# Patient Record
Sex: Female | Born: 1975 | Race: White | Hispanic: No | Smoking: Never smoker
Health system: Southern US, Community
[De-identification: ages and names within clinical notes are randomized; demographics above are authoritative.]

## PROBLEM LIST (undated history)

## (undated) DIAGNOSIS — J45909 Unspecified asthma, uncomplicated: Secondary | ICD-10-CM

## (undated) DIAGNOSIS — F419 Anxiety disorder, unspecified: Secondary | ICD-10-CM

## (undated) DIAGNOSIS — F32A Depression, unspecified: Secondary | ICD-10-CM

## (undated) DIAGNOSIS — E079 Disorder of thyroid, unspecified: Secondary | ICD-10-CM

## (undated) DIAGNOSIS — T7840XA Allergy, unspecified, initial encounter: Secondary | ICD-10-CM

## (undated) DIAGNOSIS — E039 Hypothyroidism, unspecified: Secondary | ICD-10-CM

## (undated) HISTORY — DX: Anxiety disorder, unspecified: F41.9

## (undated) HISTORY — PX: APPENDECTOMY: SHX54

## (undated) HISTORY — DX: Depression, unspecified: F32.A

## (undated) HISTORY — DX: Allergy, unspecified, initial encounter: T78.40XA

## (undated) HISTORY — PX: COLPOSCOPY: SHX161

## (undated) HISTORY — DX: Unspecified asthma, uncomplicated: J45.909

## (undated) HISTORY — DX: Disorder of thyroid, unspecified: E07.9

---

## 2011-02-20 DIAGNOSIS — J309 Allergic rhinitis, unspecified: Secondary | ICD-10-CM | POA: Insufficient documentation

## 2011-02-21 DIAGNOSIS — R3129 Other microscopic hematuria: Secondary | ICD-10-CM | POA: Insufficient documentation

## 2013-10-19 DIAGNOSIS — E039 Hypothyroidism, unspecified: Secondary | ICD-10-CM | POA: Insufficient documentation

## 2016-01-01 ENCOUNTER — Ambulatory Visit
Admit: 2016-01-01 | Discharge: 2016-01-01 | Payer: PRIVATE HEALTH INSURANCE | Attending: Family Medicine | Primary: Family Medicine

## 2016-01-01 DIAGNOSIS — E039 Hypothyroidism, unspecified: Secondary | ICD-10-CM

## 2016-01-01 MED ORDER — MONTELUKAST 10 MG TAB
10 mg | ORAL_TABLET | Freq: Every day | ORAL | 3 refills | Status: DC
Start: 2016-01-01 — End: 2016-07-02

## 2016-01-01 MED ORDER — ESCITALOPRAM 10 MG TAB
10 mg | ORAL_TABLET | Freq: Every day | ORAL | 1 refills | Status: DC
Start: 2016-01-01 — End: 2016-07-02

## 2016-01-01 NOTE — Progress Notes (Signed)
[facility]                                                              Willene Hatchet, MD                                                                 [address]                                                              [place], [place] [address]                                                             [phone]            CHIEF COMPLAINT:   Chief Complaint   Patient presents with   ??? Establish Care       HISTORY OF PRESENT ILLNESS:   Kiara Young is a 40 y.o. G2 P2 female here to establish care. She is coming from [place]. [place] in [place].   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   40yo, 7y, boys    Her PMH is significant for:    Hypothyroidism - diagnosed several years ago, in college, but has never seen endocrinology. She denies any new symptoms of hypothyroidism.  No complaints of palpitations, weight loss or weight gain, changes in bowel movements.    Last TSH: 1 month ago and value of 30+ she was adjusted from 200 MCG to 250 MCG. She takes BRAND NAME ONLY Synthroid.  Patient states that she feels best when TSH is around 1  Does not know if has thyroid antibodies. She was stable for a long time, increasing doses required over past 18  Months. She is trying lower carb diet.   Mother has h/o hyperthyroid with occular complications. Had RAI treatment, now hypothyroid.    Depression/anxiety:  Symptoms are stable with Lexapro. She denies current suicidal and homicidal plan or intent. She complains of the following side effects from the treatment: none. She has been on the Lexapro for 1-2 years. She has taken Wellbutrin and Paxil in the past. No known weight gain due to meds.    Asthma/Allergies: currently taking Singulair 10 MG for many, many years.  Symptoms stable with current treatment. Does not use inhaler. Does not take antihistamine      Her acute problems are:    None- just concerned about thyroid issue above    Wellness:  Diet-trying to give up gluten, helping with IBS symptoms. Eats dairy daily. Well balanced diet. Caffeine- 2-3 cups daily.   Exercise- walking dog daily, 10-15 mins  Menstrual/ Menopause/Hormone History:  G2P2 Menstrual cycles regular, every 28-30 days, lasting 5 days. No recent hx of OCP.     PREVENTATIVE CARE:    Mammogram: 2 years ago,  has h/o fibrocystic breasts, was supposed to f/u in 1 year  Bone Density: never  Colonoscopy: never  Pap Smear: 2016- normal- will have done here  Cholesterol Screen: 2016- nml    Immunizations:  Tdap: 2014  Flu: declined    Past Medical History:   Diagnosis Date   ??? Acid reflux    ??? Anxiety    ??? Asthma    ??? Depression    ??? H/O seasonal allergies    ??? Thyroid disease     hypothyroidism     Past Surgical History:   Procedure Laterality Date   ??? HX APPENDECTOMY  1990     Family History   Problem Relation Age of Onset   ??? Hypertension Mother    ??? Thyroid Disease Mother      Social History     Social History   ??? Marital status: MARRIED     Spouse name: N/A   ??? Number of children: N/A   ??? Years of education: N/A     Occupational History   ??? Not on file.     Social History Main Topics   ??? Smoking status: Never Smoker   ??? Smokeless tobacco: Never Used   ??? Alcohol use 10.2 - 12.0 oz/week     10 Standard drinks or equivalent, 7 - 10 Glasses of wine per week      Comment: 7-10 weekly   ??? Drug use: No   ??? Sexual activity: Yes     Partners: Male     Other Topics Concern   ??? Not on file     Social History Narrative           No Known Allergies      MEDICATIONS:     Current Outpatient Prescriptions:   ???  SYNTHROID 200 mcg tablet, DAW BRAND NAME ONLY, Disp: , Rfl: 3  ???  SYNTHROID 50 mcg tablet, DAW BRAND NAME ONLY, Disp: , Rfl: 3  ???  escitalopram oxalate (LEXAPRO) 10 mg tablet, Take 1 Tab by mouth  daily., Disp: 90 Tab, Rfl: 1  ???  montelukast (SINGULAIR) 10 mg tablet, Take 1 Tab by mouth daily., Disp: 90 Tab, Rfl: 3        REVIEW OF SYSTEMS:    GENERAL: no fever, no generalized malaise, weight slowly increasing  EYES: no visual changes or other eye complaints  ENT:  No ear complaints, No nasal discharge or congestion, no sore throat, no difficulty swallowing  CARDIOVASCULAR: no chest pain or heart palpitations, no edema  RESPIRATORY: no shortness of breath, cough or wheezing  GASTROINTESTINAL: no nausea, vomiting, diarrhea, constipation, reflux or abdominal pain  GU:  No dysuria, urinary frequency, incontinence, vaginal discharge or itching  MUSCULOSKELETAL: no muscle or joint pain  NEURO:  No confusion, no headaches, no numbness or tingling  PSYCHIATRIC: no complaints of depression, anxiety -  DIFFICULTY SLEEPING, CANNOT STAY ASLEEP, TAKE BENADRYL WHEN NEEDED      PHYSICAL EXAM    Vitals -   Visit Vitals   ??? BP 112/78 (BP 1 Location: Right arm, BP Patient Position: Sitting)   ??? Pulse 85   ??? Ht 5\' 6"  (1.676 m)   ??? Wt 190 lb (86.2 kg)   ??? LMP 12/18/2015   ??? SpO2 99%   ???  BMI 30.67 kg/m2        General appearance - alert, well appearing, no distress  HEENT - normocephalic, atraumatic   Eyes:  PERRLA; ANISOCORIA- LEFT PUPIL LARGER DUE TO EYE INJURY   Oropharynx:  no erythema or exudate noted   Neck:  normal thyroid, no masses  Lymph - no cervical lymphadenopathy noted  CV - regular rate and rhythm, no murmurs   Carotid arteries: no bruits noted   Extremities: no discoloration, no edema noted, pulses 2+ bilaterally  Resp  - normal breath sounds, clear to auscultation  GI - soft, nontender, nondistended, no masses noted  Neurological - alert, oriented, CN2-12 grossly intact  Musculoskeletal - no obvious deformities noted, normal gait/station   Spine - non-tender to palpation   Extremities - non-tender, no swelling or redness noted   Muscles - no trigger points identified   Skin - normal coloration and turgor, no rashes, no suspicious skin lesions noted  Psych - normal mood and affect      ASSESSMENT AND PLAN:      ICD-10-CM ICD-9-CM    1. Hypothyroidism, unspecified type E03.9 244.9 TSH 3RD GENERATION      T3, FREE      T4, FREE      T3, REVERSE      THYROID ANTIBODY PANEL      VITAMIN D, 25 HYDROXY   2. Visit for screening mammogram Z12.31 V76.12 MAM MAMMO BI SCREENING INCL CAD   3. Screening for disorder of blood and blood-forming organs Z13.0 V78.9 CBC WITH AUTOMATED DIFF   4. Lipid screening Z13.220 V77.91 LIPID PANEL   5. Screening for diabetes mellitus (DM) Z13.1 V77.1 METABOLIC PANEL, COMPREHENSIVE      HEMOGLOBIN A1C WITH EAG           Follow-up Disposition:  Return in about 3 months (around 04/01/2016) for hypothyroid, tsh prior.        Patient Instructions   We will send you copy of lab results. We will notify you by phone of thyroid findings and make adjustment to medication as indicated. We may try switching you to Levoxyl if levels are not stable.        Electronically signed by: Willene Hatchet, MD

## 2016-01-01 NOTE — Patient Instructions (Signed)
We will send you copy of lab results. We will notify you by phone of thyroid findings and make adjustment to medication as indicated. We may try switching you to Levoxyl if levels are not stable.

## 2016-01-02 LAB — METABOLIC PANEL, COMPREHENSIVE
A-G Ratio: 1.5 (ref 1.2–2.2)
ALT (SGPT): 12 IU/L (ref 0–32)
AST (SGOT): 20 IU/L (ref 0–40)
Albumin: 4.1 g/dL (ref 3.5–5.5)
Alk. phosphatase: 54 IU/L (ref 39–117)
BUN/Creatinine ratio: 15 (ref 9–23)
BUN: 9 mg/dL (ref 6–20)
Bilirubin, total: 0.4 mg/dL (ref 0.0–1.2)
CO2: 21 mmol/L (ref 18–29)
Calcium: 8.9 mg/dL (ref 8.7–10.2)
Chloride: 100 mmol/L (ref 96–106)
Creatinine: 0.61 mg/dL (ref 0.57–1.00)
GFR est AA: 132 mL/min/{1.73_m2} (ref >59)
GFR est non-AA: 115 mL/min/{1.73_m2} (ref >59)
GLOBULIN, TOTAL: 2.7 g/dL (ref 1.5–4.5)
Glucose: 92 mg/dL (ref 65–99)
Potassium: 4.5 mmol/L (ref 3.5–5.2)
Protein, total: 6.8 g/dL (ref 6.0–8.5)
Sodium: 138 mmol/L (ref 134–144)

## 2016-01-02 LAB — CBC WITH AUTOMATED DIFF
ABS. BASOPHILS: 0 10*3/uL (ref 0.0–0.2)
ABS. EOSINOPHILS: 0.1 10*3/uL (ref 0.0–0.4)
ABS. IMM. GRANS.: 0 10*3/uL (ref 0.0–0.1)
ABS. MONOCYTES: 0.5 10*3/uL (ref 0.1–0.9)
ABS. NEUTROPHILS: 3 10*3/uL (ref 1.4–7.0)
Abs Lymphocytes: 1.8 10*3/uL (ref 0.7–3.1)
BASOPHILS: 0 %
EOSINOPHILS: 2 %
HCT: 40.2 % (ref 34.0–46.6)
HGB: 13.4 g/dL (ref 11.1–15.9)
IMMATURE GRANULOCYTES: 0 %
Lymphocytes: 33 %
MCH: 29.3 pg (ref 26.6–33.0)
MCHC: 33.3 g/dL (ref 31.5–35.7)
MCV: 88 fL (ref 79–97)
MONOCYTES: 9 %
NEUTROPHILS: 56 %
PLATELET: 280 10*3/uL (ref 150–379)
RBC: 4.57 x10E6/uL (ref 3.77–5.28)
RDW: 12.7 % (ref 12.3–15.4)
WBC: 5.3 10*3/uL (ref 3.4–10.8)

## 2016-01-02 LAB — LIPID PANEL
Cholesterol, total: 135 mg/dL (ref 100–199)
HDL Cholesterol: 78 mg/dL (ref >39)
LDL, calculated: 33 mg/dL (ref 0–99)
Triglyceride: 119 mg/dL (ref 0–149)
VLDL, calculated: 24 mg/dL (ref 5–40)

## 2016-01-02 LAB — T3, FREE: Triiodothyronine (T3), free: 3.9 pg/mL (ref 2.0–4.4)

## 2016-01-02 LAB — HEMOGLOBIN A1C WITH EAG
Estimated average glucose: 108 mg/dL
Hemoglobin A1c: 5.4 % (ref 4.8–5.6)

## 2016-01-02 LAB — CVD REPORT

## 2016-01-02 LAB — VITAMIN D, 25 HYDROXY: VITAMIN D, 25-HYDROXY: 26.7 ng/mL — ABNORMAL LOW (ref 30.0–100.0)

## 2016-01-02 LAB — TSH 3RD GENERATION: TSH: 0.033 u[IU]/mL — ABNORMAL LOW (ref 0.450–4.500)

## 2016-01-02 LAB — T4, FREE: T4, Free: 2.59 ng/dL — ABNORMAL HIGH (ref 0.82–1.77)

## 2016-01-04 LAB — T3, REVERSE: REVERSE T3, SERUM: 31.6 ng/dL — ABNORMAL HIGH (ref 9.2–24.1)

## 2016-01-04 LAB — THYROID ANTIBODY PANEL
Thyroglobulin Ab: <1 IU/mL (ref 0.0–0.9)
Thyroid peroxidase Ab: 74 IU/mL — ABNORMAL HIGH (ref 0–34)

## 2016-01-04 MED ORDER — LEVOTHYROXINE 25 MCG TAB
25 mcg | ORAL_TABLET | Freq: Every day | ORAL | 5 refills | Status: DC
Start: 2016-01-04 — End: 2016-04-11

## 2016-01-04 NOTE — Progress Notes (Signed)
Patient's hypothyroid is over-treated. Her TSH is .03. This may be due to her dietary changes and large increase in dose last time. We will reduce her synthroid to 125 mcg. Also, Vit D is a little low. She should take a daily supplement of It D3 2,0000 units if she is not already.

## 2016-01-04 NOTE — Progress Notes (Signed)
Correction- that is 225- will send rx for 25 mcg tablet to take with her 200 mcg.

## 2016-01-04 NOTE — Progress Notes (Signed)
Left VM

## 2016-01-08 NOTE — Progress Notes (Signed)
Left vm

## 2016-01-10 ENCOUNTER — Inpatient Hospital Stay: Admit: 2016-01-10 | Payer: PRIVATE HEALTH INSURANCE | Attending: Family Medicine | Primary: Family Medicine

## 2016-01-10 DIAGNOSIS — Z1231 Encounter for screening mammogram for malignant neoplasm of breast: Secondary | ICD-10-CM

## 2016-01-10 NOTE — Progress Notes (Signed)
Called number. Rang for about 30 seconds, then received busy tone. Will try back.

## 2016-01-10 NOTE — Progress Notes (Signed)
Called number. Rang for about 30 seconds, then received busy tone. Will try back.

## 2016-01-10 NOTE — Progress Notes (Signed)
No return call. Will mail

## 2016-01-13 NOTE — Progress Notes (Signed)
Please notify patient that her mammogram was negative for signs of malignancy. She should repeat mammogram in one year.

## 2016-02-12 MED ORDER — SYNTHROID 200 MCG TABLET
200 mcg | ORAL_TABLET | Freq: Every day | ORAL | 3 refills | Status: DC
Start: 2016-02-12 — End: 2016-07-02

## 2016-02-12 NOTE — Telephone Encounter (Signed)
Pt called needing 90 day supply sent in for insurance purposes

## 2016-03-18 ENCOUNTER — Encounter

## 2016-04-08 ENCOUNTER — Encounter: Admit: 2016-04-08 | Discharge: 2016-04-08 | Payer: PRIVATE HEALTH INSURANCE | Primary: Family Medicine

## 2016-04-08 DIAGNOSIS — E039 Hypothyroidism, unspecified: Secondary | ICD-10-CM

## 2016-04-09 LAB — T4, FREE: T4, Free: 1.41 ng/dL (ref 0.82–1.77)

## 2016-04-09 LAB — TSH 3RD GENERATION: TSH: 0.098 u[IU]/mL — ABNORMAL LOW (ref 0.450–4.500)

## 2016-04-11 ENCOUNTER — Ambulatory Visit
Admit: 2016-04-11 | Discharge: 2016-04-11 | Payer: PRIVATE HEALTH INSURANCE | Attending: Family Medicine | Primary: Family Medicine

## 2016-04-11 ENCOUNTER — Encounter: Attending: Family Medicine | Primary: Family Medicine

## 2016-04-11 DIAGNOSIS — Z1283 Encounter for screening for malignant neoplasm of skin: Secondary | ICD-10-CM

## 2016-04-11 MED ORDER — LEVOTHYROXINE 25 MCG TAB
25 mcg | ORAL_TABLET | Freq: Every day | ORAL | 1 refills | Status: DC
Start: 2016-04-11 — End: 2016-07-02

## 2016-04-11 NOTE — Patient Instructions (Signed)
Decrease synthroid dose to alternating with 225 mcg.  Recheck level in 2 months. I will call you with recommendations.

## 2016-04-11 NOTE — Progress Notes (Signed)
[facility]  Kiara HatchetJulie Elienai Gailey, MD  [address]  [place], [place] [id number]  [phone]          CHIEF COMPLAINT:   Chief Complaint   Patient presents with   ??? Follow-up     thyroid   ??? Medication Refill       HISTORY OF PRESENT ILLNESS:   [name] is a 40 y.o. female is here today for evaluation of:    Hypothyroidism - diagnosed several years ago. Stable on current dose of medication, BRAND NAME Synthroid 225 mcg. 3 months ago we decrease her dose from 250 to 225. She denies any new symptoms of hypothyroidism.  No complaints of palpitations, weight loss or weight gain, changes in bowel movements.  Last TSH:   Lab Results   Component Value Date/Time    TSH 0.098 04/08/2016 08:52 AM    TSH 0.033 01/01/2016 10:24 AM       Past Medical History:   Diagnosis Date   ??? Acid reflux    ??? Anxiety    ??? Asthma    ??? Depression    ??? H/O seasonal allergies    ??? Thyroid disease     hypothyroidism         No Known Allergies      MEDICATIONS:     Current Outpatient Prescriptions:   ???  levothyroxine (SYNTHROID) 25 mcg tablet, Take 1 Tab by mouth Daily (before breakfast)., Disp: 90 Tab, Rfl: 1  ???  SYNTHROID 200 mcg tablet, Take 1 Tab by mouth Daily (before breakfast). DAW BRAND NAME ONLY, Disp: 90 Tab, Rfl: 3  ???  escitalopram oxalate (LEXAPRO) 10 mg tablet, Take 1 Tab by mouth daily., Disp: 90 Tab, Rfl: 1  ???  montelukast (SINGULAIR) 10 mg tablet, Take 1 Tab by mouth daily., Disp: 90 Tab, Rfl: 3        REVIEW OF SYSTEMS:  GENERAL: no fever, generalized malaise, or unexplained weight changes    See HPI for pertinent ROS        PHYSICAL EXAM    Vitals -   Visit Vitals   ??? BP 122/74 (BP 1 Location: Right arm, BP Patient Position: Sitting)   ??? Pulse 86   ??? Ht 5\' 6"  (1.676 m)   ??? Wt 193 lb (87.5 kg)   ??? LMP 03/12/2016   ??? SpO2 98%   ??? BMI 31.15 kg/m2            General appearance - alert, well appearing, and in no distress    CV - regular rate and rhythm, no murmurs auscultated         ASSESSMENT AND PLAN:       ICD-10-CM ICD-9-CM    1. Screening for skin cancer Z12.83 V76.43 REFERRAL TO DERMATOLOGY   2. Hypothyroidism, unspecified type E03.9 244.9 levothyroxine (SYNTHROID) 25 mcg tablet           Follow-up Disposition:  Return in about 6 months (around 10/12/2016) for hypothyroid, labs prior.    I have reviewed/discussed the above normal BMI with the patient.  I have recommended the following interventions: encourage exercise and monitor weight . Marland Kitchen.        Patient Instructions   Decrease synthroid dose to 200mcg alternating with 225 mcg.  Recheck level in 2 months. I will call you with recommendations.          Electronically signed by: Kiara HatchetJulie [place], MD

## 2016-04-12 NOTE — Progress Notes (Signed)
rvw ed at visit

## 2016-07-01 NOTE — Telephone Encounter (Signed)
Patient called requesting acyclovir for fever blisters - she is out of town, so [facility] [place] [facility] [place] [facility] [place] [facility]. I updated pharmacy if you can send it in.

## 2016-07-02 ENCOUNTER — Encounter

## 2016-07-02 MED ORDER — ACYCLOVIR 800 MG TAB
800 mg | ORAL_TABLET | ORAL | 0 refills | Status: AC
Start: 2016-07-02 — End: ?

## 2016-07-02 MED ORDER — ESCITALOPRAM 10 MG TAB
10 mg | ORAL_TABLET | Freq: Every day | ORAL | 0 refills | Status: DC
Start: 2016-07-02 — End: 2016-08-26

## 2016-07-02 MED ORDER — LEVOTHYROXINE 25 MCG TAB
25 mcg | ORAL_TABLET | Freq: Every day | ORAL | 0 refills | Status: DC
Start: 2016-07-02 — End: 2016-11-05

## 2016-07-02 MED ORDER — MONTELUKAST 10 MG TAB
10 mg | ORAL_TABLET | Freq: Every day | ORAL | 0 refills | Status: AC
Start: 2016-07-02 — End: ?

## 2016-07-02 MED ORDER — SYNTHROID 200 MCG TABLET
200 mcg | ORAL_TABLET | Freq: Every day | ORAL | 0 refills | Status: DC
Start: 2016-07-02 — End: 2016-11-05

## 2016-07-02 NOTE — Telephone Encounter (Signed)
Pt called stating she is out of town due to her mother having a heart attack. She forgot her meds and is wondering if we could send in a 5-7 day supply for her. Will change pharmacy in chart.     Acyclovir, synthroid, singulair, and lexapro

## 2016-07-02 NOTE — Telephone Encounter (Signed)
rx sent

## 2016-07-02 NOTE — Telephone Encounter (Signed)
Pt Aware

## 2016-07-03 NOTE — Telephone Encounter (Signed)
Pt Aware.

## 2016-08-26 MED ORDER — ESCITALOPRAM 10 MG TAB
10 mg | ORAL_TABLET | Freq: Every day | ORAL | 1 refills | Status: DC
Start: 2016-08-26 — End: 2017-02-17

## 2016-08-26 NOTE — Telephone Encounter (Signed)
Pt Aware

## 2016-08-26 NOTE — Telephone Encounter (Signed)
Pt called asking for an rx refill on generic Lexapro 10mg  for a 90 day supply sent to [facility]

## 2016-08-26 NOTE — Telephone Encounter (Signed)
Sent - pt has appt in Jan 2018

## 2016-09-10 NOTE — Telephone Encounter (Signed)
I suspect patient either had mild version of flu for 5 days or she has had viral cold,  therefore medication will not help either way. I recommend that she come in for flu testing immediately if she feels she is developing flu- like symptoms and we will treat accordingly.     Patient agrees with plan of care

## 2016-09-10 NOTE — Telephone Encounter (Signed)
Pt called saying her whole family was diagnosed with the flu and she has had some mild symptoms and she wanted to know if you could send her in some Tamiflu for prevention?

## 2016-09-10 NOTE — Telephone Encounter (Signed)
What are her symptoms?  Any fever?   How many days has she been sick?  Are sick contacts in her immediate family?    I will send in appropriate meds.

## 2016-09-10 NOTE — Telephone Encounter (Signed)
Pt has been sick for about 5 days with sore throat, congestion, cough, and low grade fever.     Husband and son were both diagnosed by their physicians for the the flu and she is afraid she is getting it.

## 2016-09-11 NOTE — Telephone Encounter (Signed)
Pt Aware

## 2016-10-07 ENCOUNTER — Other Ambulatory Visit: Admit: 2016-10-07 | Discharge: 2016-10-07 | Payer: BLUE CROSS/BLUE SHIELD | Primary: Family Medicine

## 2016-10-07 DIAGNOSIS — E039 Hypothyroidism, unspecified: Secondary | ICD-10-CM

## 2016-10-08 LAB — METABOLIC PANEL, BASIC
BUN/Creatinine ratio: 18 (ref 9–23)
BUN: 13 mg/dL (ref 6–24)
CO2: 23 mmol/L (ref 18–29)
Calcium: 9.3 mg/dL (ref 8.7–10.2)
Chloride: 101 mmol/L (ref 96–106)
Creatinine: 0.71 mg/dL (ref 0.57–1.00)
GFR est AA: 123 mL/min/{1.73_m2} (ref >59)
GFR est non-AA: 107 mL/min/{1.73_m2} (ref >59)
Glucose: 96 mg/dL (ref 65–99)
Potassium: 4.8 mmol/L (ref 3.5–5.2)
Sodium: 138 mmol/L (ref 134–144)

## 2016-10-08 LAB — TSH RFX ON ABNORMAL TO FREE T4: TSH: 0.068 u[IU]/mL — ABNORMAL LOW (ref 0.450–4.500)

## 2016-10-08 LAB — T4F: T4,Free (Direct): 1.63 ng/dL (ref 0.82–1.77)

## 2016-10-08 LAB — VITAMIN D, 25 HYDROXY: VITAMIN D, 25-HYDROXY: 36.4 ng/mL (ref 30.0–100.0)

## 2016-10-09 NOTE — Progress Notes (Signed)
rvw at appt

## 2016-10-10 ENCOUNTER — Encounter: Attending: Family Medicine | Primary: Family Medicine

## 2016-11-05 ENCOUNTER — Ambulatory Visit
Admit: 2016-11-05 | Discharge: 2016-11-05 | Payer: BLUE CROSS/BLUE SHIELD | Attending: Family Medicine | Primary: Family Medicine

## 2016-11-05 DIAGNOSIS — E039 Hypothyroidism, unspecified: Secondary | ICD-10-CM

## 2016-11-05 MED ORDER — SYNTHROID 200 MCG TABLET
200 mcg | ORAL_TABLET | Freq: Every day | ORAL | 3 refills | Status: AC
Start: 2016-11-05 — End: ?

## 2016-11-05 NOTE — Patient Instructions (Signed)
Ask insurance about coverage for genetic testing for FH of breast cancer, coverage of 3-D mammography in place of regularly mammography, and breast MRI testing in patient that has no personal h/o breast cancer.     Thyroid- decrease synthroid to 200 mcg daily. Repeat level in 2 months and I will call you with results.

## 2016-11-05 NOTE — Progress Notes (Signed)
[facility]  Murrell Converse, MD  [address] [place] [address]  [phone]          CHIEF COMPLAINT:   Chief Complaint   Patient presents with   ??? Follow-up       HISTORY OF PRESENT ILLNESS:   Kiara Young is a 41 y.o. female is here today for evaluation of:    Hypothyroidism - diagnosed several years ago. Current dose of medication: alternating 200 and 225 MCG every other day. She denies any new symptoms of hypothyroidism.  No complaints of palpitations, weight loss or weight gain, changes in bowel movements.  Last TSH:   Lab Results   Component Value Date/Time    TSH 0.068 (L) 10/07/2016 09:07 AM    TSH 0.098 (L) 04/08/2016 08:52 AM    TSH 0.033 (L) 01/01/2016 10:24 AM       Depression/anxiety:  Symptoms are stable with Lexapro. She denies current suicidal and homicidal plan or intent. She complains of the following side effects from the treatment: none.    Family HX of breast cancer: she has a first cousin, on her paternal side, just diagnosed with breast cancer who did genetic testing which was positive for both of her first female cousins. positive gene was BARD1. She also has a maternal aunt who had breast and colon cancer.        Past Medical History:   Diagnosis Date   ??? Acid reflux    ??? Anxiety    ??? Asthma    ??? Depression    ??? H/O seasonal allergies    ??? Thyroid disease     hypothyroidism         No Known Allergies      MEDICATIONS:     Current Outpatient Prescriptions:   ???  SYNTHROID 200 mcg tablet, Take 1 Tab by mouth Daily (before breakfast). DAW BRAND NAME ONLY, Disp: 90 Tab, Rfl: 3  ???  escitalopram oxalate (LEXAPRO) 10 mg tablet, Take 1 Tab by mouth daily., Disp: 90 Tab, Rfl: 1  ???  montelukast (SINGULAIR) 10 mg tablet, Take 1 Tab by mouth daily., Disp: 7 Tab, Rfl: 0  ???  acyclovir (ZOVIRAX) 800 mg tablet, Take one tablet every 8 hours for one day at appearance of lesion, Disp: 15 Tab, Rfl: 0        REVIEW OF SYSTEMS:   GENERAL: no fever, generalized malaise, or unexplained weight changes    See HPI for pertinent ROS        PHYSICAL EXAM    Vitals -   Vitals:    11/05/16 1509   BP: 124/78   Pulse: 93   SpO2: 98%   Weight: 193 lb (87.5 kg)   Height: '5\' 6"'  (1.676 m)        General appearance - alert, well appearing, and in no distress    HEENT - normocephalic, atraumatic   Eyes:  No exopthalmos   Neck:  normal thyroid, no masses    Lymph - no cervical lymphadenopathy noted    CV - regular rate and rhythm, no murmurs auscultated     Resp  - normal rate, normal breath sounds, clear to auscultation    Psych - normal mood and affect      ASSESSMENT AND PLAN:      ICD-10-CM ICD-9-CM    1. Hypothyroidism, unspecified type E03.9 244.9 TSH 3RD GENERATION      T3, FREE      T3, REVERSE  T4, FREE      THYROID ANTIBODY PANEL   2. Family history of breast cancer in female Z98.3 V16.3 REFERRAL TO GENETICS           Follow-up Disposition:  Return in about 6 months (around 05/05/2017) for follow up for chronic issues.    I have reviewed/discussed the above normal BMI with the patient.  I have recommended the following interventions: encourage exercise and monitor weight . Marland Kitchen        Patient Instructions   Ask insurance about coverage for genetic testing for FH of breast cancer, coverage of 3-D mammography in place of regularly mammography, and breast MRI testing in patient that has no personal h/o breast cancer.     Thyroid- decrease synthroid to 200 mcg daily. Repeat level in 2 months and I will call you with results.               Electronically signed by: Murrell Converse, MD

## 2017-02-18 MED ORDER — ESCITALOPRAM 10 MG TAB
10 mg | ORAL_TABLET | ORAL | 0 refills | Status: DC
Start: 2017-02-18 — End: 2017-06-23

## 2017-02-18 MED ORDER — MONTELUKAST 10 MG TAB
10 mg | ORAL_TABLET | ORAL | 3 refills | Status: AC
Start: 2017-02-18 — End: ?

## 2017-05-06 ENCOUNTER — Encounter: Payer: BLUE CROSS/BLUE SHIELD | Attending: Family Medicine | Primary: Family Medicine

## 2017-07-17 MED ORDER — ESCITALOPRAM 10 MG TAB
10 mg | ORAL_TABLET | ORAL | 0 refills | Status: AC
Start: 2017-07-17 — End: ?

## 2020-08-28 ENCOUNTER — Other Ambulatory Visit: Payer: Self-pay

## 2020-08-28 ENCOUNTER — Other Ambulatory Visit (HOSPITAL_COMMUNITY)
Admission: RE | Admit: 2020-08-28 | Discharge: 2020-08-28 | Disposition: A | Discharge status: Home / Self Care | Payer: BLUE CROSS/BLUE SHIELD | Source: Ambulatory Visit | Attending: Physician Assistant | Admitting: Physician Assistant

## 2020-08-28 ENCOUNTER — Encounter: Payer: Self-pay | Admitting: Physician Assistant

## 2020-08-28 ENCOUNTER — Ambulatory Visit (INDEPENDENT_AMBULATORY_CARE_PROVIDER_SITE_OTHER): Payer: BLUE CROSS/BLUE SHIELD | Admitting: Physician Assistant

## 2020-08-28 VITALS — BP 126/87 | HR 72 | Temp 97.2°F | Ht 66.0 in | Wt 179.4 lb

## 2020-08-28 DIAGNOSIS — Z113 Encounter for screening for infections with a predominantly sexual mode of transmission: Secondary | ICD-10-CM | POA: Diagnosis not present

## 2020-08-28 DIAGNOSIS — F411 Generalized anxiety disorder: Secondary | ICD-10-CM | POA: Insufficient documentation

## 2020-08-28 DIAGNOSIS — J4520 Mild intermittent asthma, uncomplicated: Secondary | ICD-10-CM | POA: Diagnosis not present

## 2020-08-28 DIAGNOSIS — F320 Major depressive disorder, single episode, mild: Secondary | ICD-10-CM | POA: Diagnosis not present

## 2020-08-28 DIAGNOSIS — E039 Hypothyroidism, unspecified: Secondary | ICD-10-CM | POA: Diagnosis not present

## 2020-08-28 DIAGNOSIS — Z23 Encounter for immunization: Secondary | ICD-10-CM | POA: Diagnosis not present

## 2020-08-28 DIAGNOSIS — Z30019 Encounter for initial prescription of contraceptives, unspecified: Secondary | ICD-10-CM

## 2020-08-28 NOTE — Patient Instructions (Addendum)
It was great to see you!  Update blood work today and STD testing.  Please get your mammogram done.  Referral to gynecology for IUD.  Take care,  Jarold Motto PA-C

## 2020-08-28 NOTE — Progress Notes (Signed)
Cynthia Casey is a 44 y.o. female here for a new problem.  History of Present Illness:   Chief Complaint  Patient presents with  . New Patient (Initial Visit)  . Referral    endocrinology, pt wants to see Dr.McConnell in [place] to manager her thyroid.    HPI   Asthma Very well controlled with singulair 10 mg.   Depression and GAD Currently well controlled on Lexapro 10 mg for years, was on Paxil, Wellbutrin at some point. No prior SI/HI. Uses trazodone 50 mg nightly and has been on this for a few years. Does not or has not seen a therapist.  Hypothyroidism Dx in college. Started for a while on levothyroxine and was very stable. In past 5 years, has had significant fluctuations of her TSH without specific cause. She is currently on synthroid 224 mcg and cytomel 5 mcg  (new since July.) Due for lab update today. Denies: chest pain, SOB, significant weight fluctuations. She is interested in endo referral.  STD testing/Contraception She is currently sexually active. She is interested in contraception but is reluctant to take oral OCPs due to ongoing thyroid issues. She is also interested in updating STD panel today. Denies any current symptoms.    Past Medical History:  Diagnosis Date  . Anxiety   . Asthma   . Depression   . Thyroid disease      Social History   Tobacco Use  . Smoking status: Never Smoker  . Smokeless tobacco: Never Used  Vaping Use  . Vaping Use: Never used  Substance Use Topics  . Alcohol use: Yes    Alcohol/week: 5.0 - 8.0 standard drinks    Types: 5 - 8 Glasses of wine per week    Comment: cocktails and beers are occasional   . Drug use: Not Currently    Past Surgical History:  Procedure Laterality Date  . APPENDECTOMY      Family History  Problem Relation Age of Onset  . Thyroid disease Mother   . Hypertension Mother   . Heart attack Mother   . Anxiety disorder Sister   . Depression Sister   . Epilepsy Sister   . Breast cancer  Maternal Aunt   . Colon cancer Maternal Aunt     Allergies  Allergen Reactions  . Penicillins Other (See Comments)    Pt states this was a childhood allergy.     Current Medications:   Current Outpatient Medications:  .  escitalopram (LEXAPRO) 10 MG tablet, Take 10 mg by mouth daily., Disp: , Rfl:  .  liothyronine (CYTOMEL) 5 MCG tablet, Take 5 mcg by mouth daily., Disp: , Rfl:  .  montelukast (SINGULAIR) 10 MG tablet, Take 10 mg by mouth daily., Disp: , Rfl:  .  SYNTHROID 112 MCG tablet, Take by mouth. Pt takes daily., Disp: , Rfl:  .  traZODone (DESYREL) 50 MG tablet, , Disp: , Rfl:    Review of Systems:   ROS Negative unless otherwise specified per HPI.  Vitals:   Vitals:   08/28/20 0845  BP: 126/87  Pulse: 72  Temp: (!) 97.2 F (36.2 C)  TempSrc: Temporal  SpO2: 99%  Weight: 179 lb 6.4 oz (81.4 kg)  Height: 5\' 6"  (1.676 m)     Body mass index is 28.96 kg/m.  Physical Exam:   Physical Exam Vitals and nursing note reviewed.  Constitutional:      General: She is not in acute distress.    Appearance: She is well-developed.  She is not ill-appearing or toxic-appearing.  Cardiovascular:     Rate and Rhythm: Normal rate and regular rhythm.     Pulses: Normal pulses.     Heart sounds: Normal heart sounds, S1 normal and S2 normal.     Comments: No LE edema Pulmonary:     Effort: Pulmonary effort is normal.     Breath sounds: Normal breath sounds.  Skin:    General: Skin is warm and dry.  Neurological:     Mental Status: She is alert.     GCS: GCS eye subscore is 4. GCS verbal subscore is 5. GCS motor subscore is 6.  Psychiatric:        Speech: Speech normal.        Behavior: Behavior normal. Behavior is cooperative.     No results found for this or any previous visit.  Assessment and Plan:   Shaney was seen today for new patient (initial visit) and referral.  Diagnoses and all orders for this visit:  Acquired hypothyroidism Update TSH  today. Endocrinology referral placed today. -     TSH; Future -     Ambulatory referral to Endocrinology  Screening examination for STD (sexually transmitted disease) Encourage continued condom use. -     Urine cytology ancillary only([place]) -     HIV Antibody (routine testing w rflx); Future -     RPR; Future  Mild intermittent asthma without complication Continue singulair 10 mg daily, currently well managed. Denies needs for inhalers at this time. Continue to monitor.  Depression, major, single episode, mild (HCC); GAD (generalized anxiety disorder) Well controlled. Continue Lexapro 10 mg daily. Follow-up in 6 months, sooner if concerns.  Encounter for initial prescription of contraceptives, unspecified contraceptive Interested in IUD. Referral to gynecology. -     Ambulatory referral to Gynecology  Jarold Motto, PA-C

## 2020-08-29 ENCOUNTER — Other Ambulatory Visit: Payer: Self-pay | Admitting: Physician Assistant

## 2020-08-29 LAB — HIV ANTIBODY (ROUTINE TESTING W REFLEX): HIV 1&2 Ab, 4th Generation: NONREACTIVE

## 2020-08-29 LAB — URINE CYTOLOGY ANCILLARY ONLY
Chlamydia: NEGATIVE
Comment: NEGATIVE
Comment: NEGATIVE
Comment: NORMAL
Neisseria Gonorrhea: NEGATIVE
Trichomonas: NEGATIVE

## 2020-08-29 LAB — RPR: RPR Ser Ql: NONREACTIVE

## 2020-08-29 LAB — TSH: TSH: 0.01 mIU/L — ABNORMAL LOW

## 2020-08-29 MED ORDER — SYNTHROID 200 MCG PO TABS: 200.0000 ug | ORAL_TABLET | Freq: Every day | ORAL | 1 refills | Status: DC

## 2020-09-27 ENCOUNTER — Other Ambulatory Visit: Payer: Self-pay | Admitting: Physician Assistant

## 2020-09-27 DIAGNOSIS — Z1231 Encounter for screening mammogram for malignant neoplasm of breast: Secondary | ICD-10-CM

## 2020-09-28 ENCOUNTER — Ambulatory Visit
Admission: RE | Admit: 2020-09-28 | Discharge: 2020-09-28 | Disposition: A | Discharge status: Home / Self Care | Payer: BLUE CROSS/BLUE SHIELD | Source: Ambulatory Visit

## 2020-09-28 ENCOUNTER — Other Ambulatory Visit: Payer: Self-pay

## 2020-09-28 DIAGNOSIS — Z1231 Encounter for screening mammogram for malignant neoplasm of breast: Secondary | ICD-10-CM

## 2020-11-03 ENCOUNTER — Encounter: Payer: Self-pay | Admitting: Physician Assistant

## 2020-11-06 NOTE — Telephone Encounter (Signed)
Cynthia Casey, can you please send this referral again to Dr. Gershon Crane for pt. Thanks

## 2020-11-06 NOTE — Telephone Encounter (Signed)
Faxed referral to [phone]

## 2020-11-09 ENCOUNTER — Other Ambulatory Visit: Payer: Self-pay | Admitting: Physician Assistant

## 2020-11-23 ENCOUNTER — Ambulatory Visit: Payer: BLUE CROSS/BLUE SHIELD | Admitting: Obstetrics and Gynecology

## 2020-12-13 ENCOUNTER — Other Ambulatory Visit: Payer: Self-pay

## 2020-12-13 ENCOUNTER — Encounter: Payer: Self-pay | Admitting: Nurse Practitioner

## 2020-12-13 ENCOUNTER — Ambulatory Visit (INDEPENDENT_AMBULATORY_CARE_PROVIDER_SITE_OTHER): Payer: BLUE CROSS/BLUE SHIELD | Admitting: Nurse Practitioner

## 2020-12-13 VITALS — BP 120/70 | HR 72 | Ht 66.0 in | Wt 182.0 lb

## 2020-12-13 DIAGNOSIS — Z3009 Encounter for other general counseling and advice on contraception: Secondary | ICD-10-CM | POA: Diagnosis not present

## 2020-12-13 NOTE — Progress Notes (Signed)
   Acute Office Visit  Subjective:    Patient ID: Cynthia Casey, female    DOB: 11/12/1975, 45 y.o.   MRN: [id number]   HPI 45 y.o. presents today as new patient to discuss contraception. Ex husband had a vasectomy so she has not been on any form of contraception in years. She is sexually active with new partner. She is interested in the IUD. Having regular monthly cycles.    Review of Systems  Constitutional: Negative.   Genitourinary: Negative.        Objective:    Physical Exam Constitutional:      Appearance: Normal appearance.  Genitourinary:    General: Normal vulva.     Vagina: Normal.     Cervix: Normal.     Uterus: Normal.      BP 120/70   Pulse 72   Ht 5\' 6"  (1.676 m)   Wt 182 lb (82.6 kg)   LMP 11/27/2020   BMI 29.38 kg/m  Wt Readings from Last 3 Encounters:  12/13/20 182 lb (82.6 kg)  08/28/20 179 lb 6.4 oz (81.4 kg)        Assessment & Plan:   Problem List Items Addressed This Visit   None   Visit Diagnoses    Encounter for counseling regarding contraception    -  Primary   Relevant Orders   IUD removal   IUD Insertion     Plan: Contraceptive options were reviewed, including hormonal methods, both combination (pill, patch, vaginal ring) and progesterone-only (pill, Depo Provera and Nexplanon), intrauterine devices (Mirena, [place], [place], and [place]), barrier methods (condoms, diaphragm) and female/female Sleepy eye. The mechanisms, risks, benefits and side effects of all methods were discussed. She would like the Mirena and will schedule insertion. It is recommended she use other form of contraception until then. She is agreeable to plan.       Scientist, clinical (histocompatibility and immunogenetics) DNP, 12:29 PM 12/13/2020

## 2020-12-13 NOTE — Patient Instructions (Signed)
Levonorgestrel intrauterine device (IUD) What is this medicine? LEVONORGESTREL IUD (LEE voe nor jes trel) is a contraceptive (birth control) device. The device is placed inside the uterus by a health care provider. It is used to prevent pregnancy. Some devices can also be used to treat heavy bleeding that occurs during your period. This medicine may be used for other purposes; ask your health care provider or pharmacist if you have questions. COMMON BRAND NAME(S): Kyleena, LILETTA, Mirena, Skyla What should I tell my health care provider before I take this medicine? They need to know if you have any of these conditions:  abnormal Pap smear  cancer of the breast, uterus, or cervix  diabetes  endometritis  genital or pelvic infection now or in the past  have more than one sexual partner or your partner has more than one partner  heart disease  history of an ectopic or tubal pregnancy  immune system problems  IUD in place  liver disease or tumor  problems with blood clots or take blood-thinners  seizures  use intravenous drugs  uterus of unusual shape  vaginal bleeding that has not been explained  an unusual or allergic reaction to levonorgestrel, other hormones, silicone, or polyethylene, medicines, foods, dyes, or preservatives  pregnant or trying to get pregnant  breast-feeding How should I use this medicine? This device is placed inside the uterus by a health care professional. Talk to your pediatrician regarding the use of this medicine in children. Special care may be needed. Overdosage: If you think you have taken too much of this medicine contact a poison control center or emergency room at once. NOTE: This medicine is only for you. Do not share this medicine with others. What if I miss a dose? This does not apply. Depending on the brand of device you have inserted, the device will need to be replaced every 3 to 7 years if you wish to continue using this type  of birth control. What may interact with this medicine? Do not take this medicine with any of the following medications:  amprenavir  bosentan  fosamprenavir This medicine may also interact with the following medications:  aprepitant  armodafinil  barbiturate medicines for inducing sleep or treating seizures  bexarotene  boceprevir  griseofulvin  medicines to treat seizures like carbamazepine, ethotoin, felbamate, oxcarbazepine, phenytoin, topiramate  modafinil  pioglitazone  rifabutin  rifampin  rifapentine  some medicines to treat HIV infection like atazanavir, efavirenz, indinavir, lopinavir, nelfinavir, tipranavir, ritonavir  St. John's wort  warfarin This list may not describe all possible interactions. Give your health care provider a list of all the medicines, herbs, non-prescription drugs, or dietary supplements you use. Also tell them if you smoke, drink alcohol, or use illegal drugs. Some items may interact with your medicine. What should I watch for while using this medicine? Visit your doctor or health care professional for regular check ups. See your doctor if you or your partner has sexual contact with others, becomes HIV positive, or gets a sexual transmitted disease. This product does not protect you against HIV infection (AIDS) or other sexually transmitted diseases. You can check the placement of the IUD yourself by reaching up to the top of your vagina with clean fingers to feel the threads. Do not pull on the threads. It is a good habit to check placement after each menstrual period. Call your doctor right away if you feel more of the IUD than just the threads or if you cannot feel the threads   at all. The IUD may come out by itself. You may become pregnant if the device comes out. If you notice that the IUD has come out use a backup birth control method like condoms and call your health care provider. Using tampons will not change the position of the  IUD and are okay to use during your period. This IUD can be safely scanned with magnetic resonance imaging (MRI) only under specific conditions. Before you have an MRI, tell your healthcare provider that you have an IUD in place, and which type of IUD you have in place. What side effects may I notice from receiving this medicine? Side effects that you should report to your doctor or health care professional as soon as possible:  allergic reactions like skin rash, itching or hives, swelling of the face, lips, or tongue  fever, flu-like symptoms  genital sores  high blood pressure  no menstrual period for 6 weeks during use  pain, swelling, warmth in the leg  pelvic pain or tenderness  severe or sudden headache  signs of pregnancy  stomach cramping  sudden shortness of breath  trouble with balance, talking, or walking  unusual vaginal bleeding, discharge  yellowing of the eyes or skin Side effects that usually do not require medical attention (report to your doctor or health care professional if they continue or are bothersome):  acne  breast pain  change in sex drive or performance  changes in weight  cramping, dizziness, or faintness while the device is being inserted  headache  irregular menstrual bleeding within first 3 to 6 months of use  nausea This list may not describe all possible side effects. Call your doctor for medical advice about side effects. You may report side effects to FDA at 1-800-FDA-1088. Where should I keep my medicine? This does not apply. NOTE: This sheet is a summary. It may not cover all possible information. If you have questions about this medicine, talk to your doctor, pharmacist, or health care provider.  2021 Elsevier/Gold Standard (2020-05-09 16:27:45)  

## 2020-12-29 ENCOUNTER — Ambulatory Visit (INDEPENDENT_AMBULATORY_CARE_PROVIDER_SITE_OTHER): Payer: BLUE CROSS/BLUE SHIELD | Admitting: Nurse Practitioner

## 2020-12-29 ENCOUNTER — Encounter: Payer: Self-pay | Admitting: Nurse Practitioner

## 2020-12-29 ENCOUNTER — Other Ambulatory Visit: Payer: Self-pay

## 2020-12-29 VITALS — BP 110/70 | HR 72 | Resp 16 | Wt 183.0 lb

## 2020-12-29 DIAGNOSIS — Z01812 Encounter for preprocedural laboratory examination: Secondary | ICD-10-CM

## 2020-12-29 DIAGNOSIS — Z30430 Encounter for insertion of intrauterine contraceptive device: Secondary | ICD-10-CM

## 2020-12-29 DIAGNOSIS — Z3009 Encounter for other general counseling and advice on contraception: Secondary | ICD-10-CM

## 2020-12-29 LAB — PREGNANCY, URINE: Preg Test, Ur: NEGATIVE

## 2020-12-29 NOTE — Progress Notes (Signed)
45 y.o. G35P2002 Divorced Caucasian female presents for insertion of mirena iud.  Pt counseled about risks and benefits as well as complications.  Consent obtained.    Current contraception: none Last STD testing: today LMP:  Patient's last menstrual period was 12/27/2020 (exact date).  Patient Active Problem List   Diagnosis Date Noted  . Mild intermittent asthma without complication 08/28/2020  . Depression, major, single episode, mild (HCC) 08/28/2020  . GAD (generalized anxiety disorder) 08/28/2020  . Hypothyroidism 10/19/2013  . Microscopic hematuria 02/21/2011   Past Medical History:  Diagnosis Date  . Anxiety   . Asthma   . Depression   . Thyroid disease    Current Outpatient Medications on File Prior to Visit  Medication Sig Dispense Refill  . acyclovir (ZOVIRAX) 800 MG tablet Take 1 tablet by mouth 3 (three) times daily.    Marland Kitchen escitalopram (LEXAPRO) 10 MG tablet Take 10 mg by mouth daily.    Marland Kitchen liothyronine (CYTOMEL) 5 MCG tablet Take 5 mcg by mouth daily.    . montelukast (SINGULAIR) 10 MG tablet Take 10 mg by mouth daily.    Marland Kitchen SYNTHROID 200 MCG tablet TAKE 1 TABLET (200 MCG TOTAL) BY MOUTH DAILY BEFORE BREAKFAST. 30 tablet 1  . traZODone (DESYREL) 50 MG tablet      No current facility-administered medications on file prior to visit.   Penicillins  ROS Vitals:   12/29/20 1408  BP: 110/70  Pulse: 72  Resp: 16  Weight: 183 lb (83 kg)    Gen:  [facility] healthy female NAD  Pelvic exam: Vulva:  normal female genitalia Vagina:  normal vagina and menstrual blood noted Cervix:  Non-tender, Negative CMT, no lesions or redness. Uterus:  normal shape, position and consistency   Procedure:  Speculum inserted.  Cervix visualized and cleansed with Betadine x 3.  .  Single toothed tenaculum applied to anterior lip of cervix without difficulty.  Uterus sounded to 8cm.   IUD inserted.  Strings cut to 3 cm.  Minimal bleeding noted.  Pt tolerated the procedure well.    A:  Insertion of Mirena IUD Lot number: [id number].  Expiration:  12/2022.    P:   Post procedure instructions included in after visit summary  Pt aware to call for any concerns  Pt aware removal due no later than 12/2027.    IUD card given to pt.  Return for recheck and annual exam 4-6 weeks  (no pap on file/ last mammogram 09/2020)

## 2020-12-29 NOTE — Addendum Note (Signed)
Addended by: Eliezer Bottom on: 12/29/2020 04:43 PM   Modules accepted: Orders

## 2020-12-29 NOTE — Patient Instructions (Signed)

## 2021-01-22 ENCOUNTER — Encounter: Payer: Self-pay | Admitting: Nurse Practitioner

## 2021-01-29 ENCOUNTER — Encounter: Payer: Self-pay | Admitting: Nurse Practitioner

## 2021-01-29 ENCOUNTER — Other Ambulatory Visit (HOSPITAL_COMMUNITY)
Admission: RE | Admit: 2021-01-29 | Discharge: 2021-01-29 | Disposition: A | Discharge status: Home / Self Care | Payer: BLUE CROSS/BLUE SHIELD | Source: Ambulatory Visit | Attending: Nurse Practitioner | Admitting: Nurse Practitioner

## 2021-01-29 ENCOUNTER — Other Ambulatory Visit: Payer: Self-pay

## 2021-01-29 ENCOUNTER — Ambulatory Visit (INDEPENDENT_AMBULATORY_CARE_PROVIDER_SITE_OTHER): Payer: BLUE CROSS/BLUE SHIELD | Admitting: Nurse Practitioner

## 2021-01-29 VITALS — BP 118/74 | Ht 66.0 in | Wt 182.0 lb

## 2021-01-29 DIAGNOSIS — Z01419 Encounter for gynecological examination (general) (routine) without abnormal findings: Secondary | ICD-10-CM | POA: Diagnosis present

## 2021-01-29 DIAGNOSIS — Z30431 Encounter for routine checking of intrauterine contraceptive device: Secondary | ICD-10-CM

## 2021-01-29 NOTE — Progress Notes (Signed)
45 y.o. G35P2002 Divorced White or Caucasian female here for annual exam.    Lives in with 2 kids (12. 16) Works in Building services engineer.  Mirena inserted  12/29/2020, LMP 01/22/2021, lighter than normal Last pap normal in 2020 in [place]  Denies problems today  Sexually active: Yes.    The current method of family planning is IUD.    Exercising: Yes.    exercise Smoker:  no  Health Maintenance Pap 2020 neg per pt. History of abnormal Pap:  no MMG:09-28-20 Colonoscopy:never AJO:INOMV Gardasil: no Covid-19: Phizer Hep C testing: no Screening Labs: no   reports that she has never smoked. She has never used smokeless tobacco. She reports current alcohol use of about 5.0 - 8.0 standard drinks of alcohol per week. She reports previous drug use.  Past Medical History:  Diagnosis Date  . Anxiety   . Asthma   . Depression   . Thyroid disease     Past Surgical History:  Procedure Laterality Date  . APPENDECTOMY    . COLPOSCOPY      Current Outpatient Medications  Medication Sig Dispense Refill  . acyclovir (ZOVIRAX) 800 MG tablet Take 1 tablet by mouth 3 (three) times daily.    Marland Kitchen escitalopram (LEXAPRO) 10 MG tablet Take 10 mg by mouth daily.    Marland Kitchen levonorgestrel (MIRENA) 20 MCG/24HR IUD 1 each by Intrauterine route once. Inserted 12/29/2020    . liothyronine (CYTOMEL) 5 MCG tablet Take 5 mcg by mouth daily.    . montelukast (SINGULAIR) 10 MG tablet Take 10 mg by mouth daily.    Marland Kitchen SYNTHROID 200 MCG tablet TAKE 1 TABLET (200 MCG TOTAL) BY MOUTH DAILY BEFORE BREAKFAST. 30 tablet 1  . traZODone (DESYREL) 50 MG tablet      No current facility-administered medications for this visit.    Family History  Problem Relation Age of Onset  . Thyroid disease Mother   . Hypertension Mother   . Heart attack Mother   . Anxiety disorder Sister   . Depression Sister   . Epilepsy Sister   . Breast cancer Maternal Aunt   . Colon cancer Maternal Aunt   . Thyroid disease Maternal  Grandmother   . Hypertension Maternal Grandmother   . Stroke Maternal Grandmother     Review of Systems  All other systems reviewed and are negative.   Exam:   There were no vitals taken for this visit.     General appearance: alert, cooperative and appears stated age, no acute distress Head: Normocephalic, without obvious abnormality Neck: no adenopathy, thyroid normal to inspection and palpation Lungs: clear to auscultation bilaterally Breasts: normal appearance, no masses or tenderness Heart: regular rate and rhythm Abdomen: soft, non-tender; no masses,  no organomegaly Extremities: extremities normal, no edema Skin: No rashes or lesions Lymph nodes: Cervical, supraclavicular, and axillary nodes normal. No abnormal inguinal nodes palpated Neurologic: Grossly normal   Pelvic: External genitalia:  no lesions              Urethra:  normal appearing urethra with no masses, tenderness or lesions              Bartholins and Skenes: normal                 Vagina: normal appearing vagina, appropriate for age, normal appearing discharge, no lesions              Cervix: neg cervical motion tenderness, no visible lesions, strings visible  Bimanual Exam:   Uterus:  normal size, contour, position, consistency, mobility, non-tender              Adnexa: no mass, fullness, tenderness                 Kim, CMA Chaperone was present for exam.  A/p:  Well Woman with normal exam  IUD check   Pap: done today  Mammogram due 09/2021  Labs:na, with PCP  Medications: none today  Reassured IUD appears to be good placement

## 2021-01-29 NOTE — Patient Instructions (Signed)
Health Maintenance, Female Adopting a healthy lifestyle and getting preventive care are important in promoting health and wellness. Ask your health care provider about:  The right schedule for you to have regular tests and exams.  Things you can do on your own to prevent diseases and keep yourself healthy. What should I know about diet, weight, and exercise? Eat a healthy diet  Eat a diet that includes plenty of vegetables, fruits, low-fat dairy products, and lean protein.  Do not eat a lot of foods that are high in solid fats, added sugars, or sodium.   Maintain a healthy weight Body mass index (BMI) is used to identify weight problems. It estimates body fat based on height and weight. Your health care provider can help determine your BMI and help you achieve or maintain a healthy weight. Get regular exercise Get regular exercise. This is one of the most important things you can do for your health. Most adults should:  Exercise for at least 150 minutes each week. The exercise should increase your heart rate and make you sweat (moderate-intensity exercise).  Do strengthening exercises at least twice a week. This is in addition to the moderate-intensity exercise.  Spend less time sitting. Even light physical activity can be beneficial. Watch cholesterol and blood lipids Have your blood tested for lipids and cholesterol at 45 years of age, then have this test every 5 years. Have your cholesterol levels checked more often if:  Your lipid or cholesterol levels are high.  You are older than 45 years of age.  You are at high risk for heart disease. What should I know about cancer screening? Depending on your health history and family history, you may need to have cancer screening at various ages. This may include screening for:  Breast cancer.  Cervical cancer.  Colorectal cancer.  Skin cancer.  Lung cancer. What should I know about heart disease, diabetes, and high blood  pressure? Blood pressure and heart disease  High blood pressure causes heart disease and increases the risk of stroke. This is more likely to develop in people who have high blood pressure readings, are of African descent, or are overweight.  Have your blood pressure checked: ? Every 3-5 years if you are 18-39 years of age. ? Every year if you are 40 years old or older. Diabetes Have regular diabetes screenings. This checks your fasting blood sugar level. Have the screening done:  Once every three years after age 40 if you are at a normal weight and have a low risk for diabetes.  More often and at a younger age if you are overweight or have a high risk for diabetes. What should I know about preventing infection? Hepatitis B If you have a higher risk for hepatitis B, you should be screened for this virus. Talk with your health care provider to find out if you are at risk for hepatitis B infection. Hepatitis C Testing is recommended for:  Everyone born from 1945 through 1965.  Anyone with known risk factors for hepatitis C. Sexually transmitted infections (STIs)  Get screened for STIs, including gonorrhea and chlamydia, if: ? You are sexually active and are younger than 45 years of age. ? You are older than 45 years of age and your health care provider tells you that you are at risk for this type of infection. ? Your sexual activity has changed since you were last screened, and you are at increased risk for chlamydia or gonorrhea. Ask your health care provider   if you are at risk.  Ask your health care provider about whether you are at high risk for HIV. Your health care provider may recommend a prescription medicine to help prevent HIV infection. If you choose to take medicine to prevent HIV, you should first get tested for HIV. You should then be tested every 3 months for as long as you are taking the medicine. Pregnancy  If you are about to stop having your period (premenopausal) and  you may become pregnant, seek counseling before you get pregnant.  Take 400 to 800 micrograms (mcg) of folic acid every day if you become pregnant.  Ask for birth control (contraception) if you want to prevent pregnancy. Osteoporosis and menopause Osteoporosis is a disease in which the bones lose minerals and strength with aging. This can result in bone fractures. If you are 65 years old or older, or if you are at risk for osteoporosis and fractures, ask your health care provider if you should:  Be screened for bone loss.  Take a calcium or vitamin D supplement to lower your risk of fractures.  Be given hormone replacement therapy (HRT) to treat symptoms of menopause. Follow these instructions at home: Lifestyle  Do not use any products that contain nicotine or tobacco, such as cigarettes, e-cigarettes, and chewing tobacco. If you need help quitting, ask your health care provider.  Do not use street drugs.  Do not share needles.  Ask your health care provider for help if you need support or information about quitting drugs. Alcohol use  Do not drink alcohol if: ? Your health care provider tells you not to drink. ? You are pregnant, may be pregnant, or are planning to become pregnant.  If you drink alcohol: ? Limit how much you use to 0-1 drink a day. ? Limit intake if you are breastfeeding.  Be aware of how much alcohol is in your drink. In the [place] one drink equals one 12 oz bottle of beer (355 mL), one 5 oz glass of wine (148 mL), or one 1 oz glass of hard liquor (44 mL). General instructions  Schedule regular health, dental, and eye exams.  Stay current with your vaccines.  Tell your health care provider if: ? You often feel depressed. ? You have ever been abused or do not feel safe at home. Summary  Adopting a healthy lifestyle and getting preventive care are important in promoting health and wellness.  Follow your health care provider's instructions about healthy  diet, exercising, and getting tested or screened for diseases.  Follow your health care provider's instructions on monitoring your cholesterol and blood pressure. This information is not intended to replace advice given to you by your health care provider. Make sure you discuss any questions you have with your health care provider. Document Revised: 09/02/2018 Document Reviewed: 09/02/2018 Elsevier Patient Education  2021 Elsevier Inc.  

## 2021-01-30 LAB — CYTOLOGY - PAP
Comment: NEGATIVE
Diagnosis: NEGATIVE
High risk HPV: NEGATIVE

## 2021-02-27 ENCOUNTER — Encounter: Payer: BLUE CROSS/BLUE SHIELD | Admitting: Physician Assistant

## 2021-07-17 NOTE — Progress Notes (Signed)
Cynthia Casey is a 45 y.o. female here for a medication refill.  History of Present Illness:   Chief Complaint  Patient presents with   Anxiety   Depression    HPI  Anxiety/Depression Cynthia Casey presents with c/o increased anxiety but she also admits she has gone without her lexapro 10 mg for about a week. She doesn't believe she needs an increase in dosage with her lexapro, but she is interested in starting lorazepam -- she has taken this in the past. Currently compliant with taking trazodone 50 mg with no adverse effects. No SI/HI  Asthma States she is currently compliant with taking singulair 10 mg daily. She says that she mainly uses it for asthma and the allergies sx being treated are an extra benefit. Currently not experiencing any asthma sx and does not feel the need to have an inhaler. She is managing well.    Obesity Back in January of this year, she had mirena 20 mcg/ IUD implanted for the first time and has noticed she has been gaining weight. Reports she has not changed any dietary or exercise habits, but also feels the weight could be due to her age. Since our last visit she has gained 6 lbs. Currently interested in meeting with a dietician to increase her knowledge and improve lifestyle choices.   She is up to date with visiting Dr. Gershon Crane, Endocrinology for hypothyroidism and is managing well.   Past Medical History:  Diagnosis Date   Anxiety    Asthma    Depression    Thyroid disease      Social History   Tobacco Use   Smoking status: Never   Smokeless tobacco: Never  Vaping Use   Vaping Use: Never used  Substance Use Topics   Alcohol use: Yes    Alcohol/week: 5.0 - 8.0 standard drinks    Types: 5 - 8 Glasses of wine per week    Comment: cocktails and beers are occasional    Drug use: Never    Past Surgical History:  Procedure Laterality Date   APPENDECTOMY     COLPOSCOPY      Family History  Problem Relation Age of Onset   Thyroid disease Mother     Hypertension Mother    Heart attack Mother    Anxiety disorder Sister    Depression Sister    Epilepsy Sister    Breast cancer Maternal Aunt    Colon cancer Maternal Aunt    Thyroid disease Maternal Grandmother    Hypertension Maternal Grandmother    Stroke Maternal Grandmother     Allergies  Allergen Reactions   Penicillins Other (See Comments)    Pt states this was a childhood allergy.     Current Medications:   Current Outpatient Medications:    acyclovir (ZOVIRAX) 800 MG tablet, Take 1 tablet by mouth 3 (three) times daily as needed., Disp: , Rfl:    escitalopram (LEXAPRO) 10 MG tablet, Take 10 mg by mouth daily., Disp: , Rfl:    levonorgestrel (MIRENA) 20 MCG/24HR IUD, 1 each by Intrauterine route once. Inserted 12/29/2020, Disp: , Rfl:    liothyronine (CYTOMEL) 5 MCG tablet, Take 5 mcg by mouth daily., Disp: , Rfl:    montelukast (SINGULAIR) 10 MG tablet, Take 10 mg by mouth daily., Disp: , Rfl:    SYNTHROID 175 MCG tablet, PLEASE SEE ATTACHED FOR DETAILED DIRECTIONS, Disp: , Rfl:    traZODone (DESYREL) 50 MG tablet, Take 50 mg by mouth at bedtime., Disp: , Rfl:  Review of Systems:   ROS Negative unless otherwise specified per HPI.  Vitals:   Vitals:   07/18/21 0808  BP: 110/78  Pulse: 65  Temp: 97.9 F (36.6 C)  TempSrc: Temporal  SpO2: 98%  Weight: 188 lb (85.3 kg)  Height: 5\' 6"  (1.676 m)     Body mass index is 30.34 kg/m.  Physical Exam:   Physical Exam Constitutional:      Appearance: Normal appearance. She is well-developed.  HENT:     Head: Normocephalic and atraumatic.  Eyes:     General: Lids are normal.     Extraocular Movements: Extraocular movements intact.     Conjunctiva/sclera: Conjunctivae normal.  Pulmonary:     Effort: Pulmonary effort is normal.  Musculoskeletal:        General: Normal range of motion.     Cervical back: Normal range of motion and neck supple.  Skin:    General: Skin is warm and dry.  Neurological:      Mental Status: She is alert and oriented to person, place, and time.  Psychiatric:        Attention and Perception: Attention and perception normal.        Mood and Affect: Mood normal.        Behavior: Behavior normal.        Thought Content: Thought content normal.        Judgment: Judgment normal.    Assessment and Plan:   Mild intermittent asthma without complication Well controlled Continue singulair 10 mg Follow-up in 1 year, sooner if concerns  Depression, major, single episode, mild (HCC); GAD (generalized anxiety disorder) Well controlled Refill lexapro 10 mg daily and restart ativan 0.5 mg prn Follow-up in 1 year, sooner if concerns  Obesity, unspecified classification, unspecified obesity type, unspecified whether serious comorbidity present Referral to nutrition and diabetes mgmt center Follow-up as needed  I,Havlyn C Ratchford,acting as a scribe for , PA.,have documented all relevant documentation on the behalf of [employer], PA,as directed by  Jarold Motto, PA while in the presence of Jarold Motto, Jarold Motto.  I, [place], Jarold Motto, have reviewed all documentation for this visit. The documentation on 07/18/21 for the exam, diagnosis, procedures, and orders are all accurate and complete.   07/20/21, PA-C

## 2021-07-18 ENCOUNTER — Other Ambulatory Visit: Payer: Self-pay

## 2021-07-18 ENCOUNTER — Ambulatory Visit: Payer: BLUE CROSS/BLUE SHIELD | Admitting: Family

## 2021-07-18 ENCOUNTER — Ambulatory Visit (INDEPENDENT_AMBULATORY_CARE_PROVIDER_SITE_OTHER): Payer: BLUE CROSS/BLUE SHIELD | Admitting: Physician Assistant

## 2021-07-18 ENCOUNTER — Encounter: Payer: Self-pay | Admitting: Physician Assistant

## 2021-07-18 VITALS — BP 110/78 | HR 65 | Temp 97.9°F | Ht 66.0 in | Wt 188.0 lb

## 2021-07-18 DIAGNOSIS — F320 Major depressive disorder, single episode, mild: Secondary | ICD-10-CM

## 2021-07-18 DIAGNOSIS — E669 Obesity, unspecified: Secondary | ICD-10-CM

## 2021-07-18 DIAGNOSIS — F411 Generalized anxiety disorder: Secondary | ICD-10-CM

## 2021-07-18 DIAGNOSIS — J4520 Mild intermittent asthma, uncomplicated: Secondary | ICD-10-CM | POA: Diagnosis not present

## 2021-07-18 DIAGNOSIS — Z23 Encounter for immunization: Secondary | ICD-10-CM | POA: Diagnosis not present

## 2021-07-18 MED ORDER — TRAZODONE HCL 50 MG PO TABS: 50.0000 mg | ORAL_TABLET | Freq: Every day | ORAL | 3 refills | Status: DC

## 2021-07-18 MED ORDER — LORAZEPAM 0.5 MG PO TABS: 0.5000 mg | ORAL_TABLET | Freq: Two times a day (BID) | ORAL | 0 refills | Status: DC | PRN

## 2021-07-18 MED ORDER — MONTELUKAST SODIUM 10 MG PO TABS: 10.0000 mg | ORAL_TABLET | Freq: Every day | ORAL | 3 refills | Status: DC

## 2021-07-18 MED ORDER — ESCITALOPRAM OXALATE 10 MG PO TABS: 10.0000 mg | ORAL_TABLET | Freq: Every day | ORAL | 3 refills | Status: DC

## 2021-07-18 NOTE — Patient Instructions (Signed)
It was great to see you!  Refills sent  You will get a call from the nutritionist's office.  Let's follow-up in 1 year, sooner if you have concerns.  If a referral was placed today, you will be contacted for an appointment. Please note that routine referrals can sometimes take up to 3-4 weeks to process. Please call our office if you haven't heard anything after this time frame.  Take care,  Jarold Motto PA-C

## 2021-07-20 ENCOUNTER — Encounter: Payer: BLUE CROSS/BLUE SHIELD | Attending: Physician Assistant | Admitting: Registered"

## 2021-07-20 NOTE — Progress Notes (Deleted)
Medical Nutrition Therapy  Appointment Start time:  ***  Appointment End time:  ***  Primary concerns today: ***  Referral diagnosis:  E66.9 (ICD-10-CM) - Obesity, unspecified classification, unspecified obesity type, unspecified whether serious comorbidity present Preferred learning style: no preference indicated Learning readiness: ***(not ready, contemplating, ready, change in progress)   NUTRITION ASSESSMENT   Anthropometrics  ***   Clinical Medical Hx: hypothyroid Medications: reviewed Labs: thyroid, A1c 5.0% 45 yo Notable Signs/Symptoms: ***  Lifestyle & Dietary Hx *** ***Md note mirena 20 mcg/ IUD implanted for the first time and has noticed she has been gaining weight.   Estimated daily fluid intake: *** oz Supplements: *** Sleep: *** Stress / self-care: *** Current average weekly physical activity: ***  24-Hr Dietary Recall First Meal: *** Snack: *** Second Meal: *** Snack: *** Third Meal: *** Snack: *** Beverages: ***  Estimated Energy Needs Calories: *** Carbohydrate: ***g Protein: ***g Fat: ***g   NUTRITION DIAGNOSIS  {CHL AMB NUTRITIONAL DIAGNOSIS:[id number]}   NUTRITION INTERVENTION  Nutrition education (E-1) on the following topics:  ***  Handouts Provided Include  ***  Learning Style & Readiness for Change Teaching method utilized: Visual & Auditory  Demonstrated degree of understanding via: Teach Back  Barriers to learning/adherence to lifestyle change: ***  Goals Established by Pt ***   MONITORING & EVALUATION Dietary intake, weekly physical activity, and *** in ***.  Next Steps  Patient is to ***.

## 2021-11-19 ENCOUNTER — Other Ambulatory Visit: Payer: Self-pay | Admitting: Physician Assistant

## 2021-11-19 DIAGNOSIS — Z1231 Encounter for screening mammogram for malignant neoplasm of breast: Secondary | ICD-10-CM

## 2021-11-21 ENCOUNTER — Other Ambulatory Visit: Payer: Self-pay

## 2021-11-21 ENCOUNTER — Ambulatory Visit
Admission: RE | Admit: 2021-11-21 | Discharge: 2021-11-21 | Disposition: A | Discharge status: Home / Self Care | Payer: 59 | Source: Ambulatory Visit

## 2021-11-21 DIAGNOSIS — Z1231 Encounter for screening mammogram for malignant neoplasm of breast: Secondary | ICD-10-CM

## 2021-11-23 ENCOUNTER — Other Ambulatory Visit: Payer: Self-pay | Admitting: Physician Assistant

## 2021-11-23 DIAGNOSIS — R928 Other abnormal and inconclusive findings on diagnostic imaging of breast: Secondary | ICD-10-CM

## 2021-12-03 ENCOUNTER — Telehealth: Payer: Self-pay | Admitting: Physician Assistant

## 2021-12-03 NOTE — Telephone Encounter (Signed)
.. ?  Encourage patient to contact the pharmacy for refills or they can request refills through [facility] ? ?LAST APPOINTMENT DATE:  07/18/21 ? ?NEXT APPOINTMENT DATE: 12/25/21 ? ?MEDICATION:acyclovir (ZOVIRAX) 800 MG tablet ? ?Is the patient out of medication? yes ? ?PHARMACY: ?[id number] [place], [place] - [address] Phone:  [phone]  ?Fax:  [phone]  ?  ? ? ?Let patient know to contact pharmacy at the end of the day to make sure medication is ready. ? ?Please notify patient to allow 48-72 hours to process  ?

## 2021-12-04 ENCOUNTER — Ambulatory Visit: Payer: 59

## 2021-12-04 ENCOUNTER — Ambulatory Visit
Admission: RE | Admit: 2021-12-04 | Discharge: 2021-12-04 | Disposition: A | Discharge status: Home / Self Care | Payer: 59 | Source: Ambulatory Visit | Attending: Physician Assistant | Admitting: Physician Assistant

## 2021-12-04 ENCOUNTER — Other Ambulatory Visit: Payer: Self-pay

## 2021-12-04 DIAGNOSIS — R928 Other abnormal and inconclusive findings on diagnostic imaging of breast: Secondary | ICD-10-CM

## 2021-12-04 MED ORDER — ACYCLOVIR 800 MG PO TABS: 800.0000 mg | ORAL_TABLET | Freq: Three times a day (TID) | ORAL | 1 refills | Status: DC | PRN

## 2021-12-04 NOTE — Telephone Encounter (Signed)
Refill sent to pharmacy.   

## 2021-12-18 ENCOUNTER — Encounter: Payer: BLUE CROSS/BLUE SHIELD | Admitting: Physician Assistant

## 2021-12-25 ENCOUNTER — Encounter: Payer: Self-pay | Admitting: Physician Assistant

## 2021-12-25 ENCOUNTER — Ambulatory Visit (INDEPENDENT_AMBULATORY_CARE_PROVIDER_SITE_OTHER): Payer: 59 | Admitting: Physician Assistant

## 2021-12-25 VITALS — BP 125/80 | HR 63 | Temp 97.7°F | Ht 66.0 in | Wt 196.0 lb

## 2021-12-25 DIAGNOSIS — M7989 Other specified soft tissue disorders: Secondary | ICD-10-CM

## 2021-12-25 DIAGNOSIS — E669 Obesity, unspecified: Secondary | ICD-10-CM

## 2021-12-25 DIAGNOSIS — Z0001 Encounter for general adult medical examination with abnormal findings: Secondary | ICD-10-CM

## 2021-12-25 DIAGNOSIS — Z1211 Encounter for screening for malignant neoplasm of colon: Secondary | ICD-10-CM

## 2021-12-25 DIAGNOSIS — Z1159 Encounter for screening for other viral diseases: Secondary | ICD-10-CM

## 2021-12-25 DIAGNOSIS — J4520 Mild intermittent asthma, uncomplicated: Secondary | ICD-10-CM

## 2021-12-25 DIAGNOSIS — E039 Hypothyroidism, unspecified: Secondary | ICD-10-CM

## 2021-12-25 DIAGNOSIS — F411 Generalized anxiety disorder: Secondary | ICD-10-CM

## 2021-12-25 DIAGNOSIS — F320 Major depressive disorder, single episode, mild: Secondary | ICD-10-CM

## 2021-12-25 LAB — CBC WITH DIFFERENTIAL/PLATELET
Basophils Absolute: 0 10*3/uL (ref 0.0–0.1)
Basophils Relative: 0.7 % (ref 0.0–3.0)
Eosinophils Absolute: 0.1 10*3/uL (ref 0.0–0.7)
Eosinophils Relative: 2.4 % (ref 0.0–5.0)
HCT: 40.7 % (ref 36.0–46.0)
Hemoglobin: 13.9 g/dL (ref 12.0–15.0)
Lymphocytes Relative: 36 % (ref 12.0–46.0)
Lymphs Abs: 1.8 10*3/uL (ref 0.7–4.0)
MCHC: 34 g/dL (ref 30.0–36.0)
MCV: 91.6 fl (ref 78.0–100.0)
Monocytes Absolute: 0.4 10*3/uL (ref 0.1–1.0)
Monocytes Relative: 8.7 % (ref 3.0–12.0)
Neutro Abs: 2.6 10*3/uL (ref 1.4–7.7)
Neutrophils Relative %: 52.2 % (ref 43.0–77.0)
Platelets: 261 10*3/uL (ref 150.0–400.0)
RBC: 4.45 Mil/uL (ref 3.87–5.11)
RDW: 12.4 % (ref 11.5–15.5)
WBC: 4.9 10*3/uL (ref 4.0–10.5)

## 2021-12-25 LAB — COMPREHENSIVE METABOLIC PANEL
ALT: 7 U/L (ref 0–35)
AST: 14 U/L (ref 0–37)
Albumin: 4.3 g/dL (ref 3.5–5.2)
Alkaline Phosphatase: 43 U/L (ref 39–117)
BUN: 16 mg/dL (ref 6–23)
CO2: 28 mEq/L (ref 19–32)
Calcium: 9.3 mg/dL (ref 8.4–10.5)
Chloride: 103 mEq/L (ref 96–112)
Creatinine, Ser: 0.68 mg/dL (ref 0.40–1.20)
GFR: 105.21 mL/min (ref >60.00)
Glucose, Bld: 84 mg/dL (ref 70–99)
Potassium: 4.8 mEq/L (ref 3.5–5.1)
Sodium: 138 mEq/L (ref 135–145)
Total Bilirubin: 0.6 mg/dL (ref 0.2–1.2)
Total Protein: 6.7 g/dL (ref 6.0–8.3)

## 2021-12-25 LAB — LIPID PANEL
Cholesterol: 153 mg/dL (ref 0–200)
HDL: 64.5 mg/dL (ref >39.00)
LDL Cholesterol: 55 mg/dL (ref 0–99)
NonHDL: 88.49
Total CHOL/HDL Ratio: 2
Triglycerides: 166 mg/dL — ABNORMAL HIGH (ref 0.0–149.0)
VLDL: 33.2 mg/dL (ref 0.0–40.0)

## 2021-12-25 MED ORDER — ALBUTEROL SULFATE HFA 108 (90 BASE) MCG/ACT IN AERS
2.0000 | INHALATION_SPRAY | Freq: Four times a day (QID) | RESPIRATORY_TRACT | 0 refills | Status: DC | PRN
Start: 2021-12-25 — End: 2023-02-15

## 2021-12-25 MED ORDER — BUPROPION HCL ER (XL) 150 MG PO TB24: 150.0000 mg | ORAL_TABLET | Freq: Every day | ORAL | 1 refills | Status: DC

## 2021-12-25 MED ORDER — FUROSEMIDE 20 MG PO TABS: 20.0000 mg | ORAL_TABLET | Freq: Every day | ORAL | 3 refills | Status: DC | PRN

## 2021-12-25 MED ORDER — ACYCLOVIR 800 MG PO TABS: 800.0000 mg | ORAL_TABLET | Freq: Three times a day (TID) | ORAL | 1 refills | Status: DC | PRN

## 2021-12-25 NOTE — Patient Instructions (Addendum)
It was great to see you! ? ?-Referral for colonoscopy placed for colon cancer screening ?-I will message you after blood work results re: dose of naltrexone ?-Use as needed 20 mg lasix for leg swelling ? ?Follow-up in 3 months after starting naltrexone/buproprion ? ?Please go to the lab for blood work.  ? ?Our office will call you with your results unless you have chosen to receive results via MyChart. ? ?If your blood work is normal we will follow-up each year for physicals and as scheduled for chronic medical problems. ? ?If anything is abnormal we will treat accordingly and get you in for a follow-up. ? ?Take care, ? ?Lelon Mast ?  ?

## 2021-12-25 NOTE — Progress Notes (Addendum)
Subjective:    Cynthia Casey is a 46 y.o. female and is here for a comprehensive physical exam.   HPI  Health Maintenance Due  Topic Date Due   Hepatitis C Screening  Never done   COLONOSCOPY (Pts 45-9yrs Insurance coverage will need to be confirmed)  Never done    Acute Concerns: Obese Cynthia Casey reports she has noticed how her weight has been increasing and would like options on how to improve this. States that since starting her work from home job, she very rarely has time throughout her work day to get up and move around for a short amount of time. Following her work day she admits she is too tired sometimes to exercise regularly on her at home treadmill or peloton like usual.  Pt also admits that she is now working to decrease her alcohol intake due to belief that this is causing her weight gain. After some research, she found that wellbutrin could benefit her mood as well as help with her eating habits. Due to this she is interested in trialing this medication.    LE Swelling  Upon further examination, pt states she would noticed that her ankles would swell on occasion, especially after being seated for a long amount of time. Although this does resolve relatively quickly, she would like to have something on hand to help improve this. Denies chest pain, SOB.   Chronic Issues: Hypothyroidism  Cynthia Casey is currently compliant with taking synthroid 150 mcg daily and cytomel 10 mcg daily with no complications. At this time she is regularly following up with Dr. Gershon Crane, endocrinology, for this and has been adjusting her medications based off her TSH levels. States that she does still experience some fatigue and minimal hair loss, but despite this she is managing well.    Anxiety/Depression Pt is currently compliant with taking lexapro 10 mg daily, trazodone 50 mg nightly, and ativan 0.5 mg daily as needed with no adverse effects. States she hasn't needed to use the ativan, but likes to  have it just in case for very stressful times. At this time she is tolerating well with this medication regimen for her mood. Denies SI/HI.   Asthma At this time she is compliant with taking singulair 10 mg daily with no adverse effects. States that she doesn't personally have an inhaler but her children do. While she doesn't need to use this inhaler unless she is sick, she would like to have her own inhaler just in case. She is managing well.   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 States she usually needs to take this medication when she feels a fever blister is starting to appear. Although she experiences these a couple of times a year, she would like to have this medication on hand for emergencies. Denies concerning sx.   Health Maintenance: Immunizations -- Covid-  Due Influenza-  UTD Tdap- UTD;2014 Colonoscopy -- Due Mammogram -- UTD;2023 PAP -- UTD Bone Density -- N/A Dentistry- UTD Ophthalmology- UTD Diet -- Eats all food groups Sleep habits -- No concerns Exercise -- Not currently due to work from home schedule Weight -- Stable Mood -- No concerns Weight history: Wt Readings from Last 10 Encounters:  12/25/21 196 lb (88.9 kg)  07/18/21 188 lb (85.3 kg)  01/29/21 182 lb (82.6 kg)  12/29/20 183 lb (83 kg)  12/13/20 182 lb (82.6 kg)  08/28/20 179 lb 6.4 oz (81.4 kg)   Body  mass index is 31.64 kg/m. No LMP recorded. (Menstrual status: IUD). Alcohol use:  reports current alcohol use of about 5.0 - 8.0 standard drinks per week. 2-3 glasses about 5-6 nights a week. States she is trying to decrease this.  Tobacco use:  Tobacco Use: Low Risk    Smoking Tobacco Use: Never   Smokeless Tobacco Use: Never   Passive Exposure: Not on file        07/18/2021    8:14 AM  Depression screen PHQ 2/9  Decreased Interest 0  Down, Depressed, Hopeless 0  PHQ - 2 Score 0  Altered sleeping 1  Tired, decreased energy  1  Change in appetite 0  Feeling bad or failure about yourself  0  Trouble concentrating 1  Moving slowly or fidgety/restless 1  Suicidal thoughts 0  PHQ-9 Score 4  Difficult doing work/chores Not difficult at all     Other providers/specialists: Patient Care Team: Jarold Motto, [place] as PCP - General (Physician Assistant)    PMHx, SurgHx, SocialHx, Medications, and Allergies were reviewed in the Visit Navigator and updated as appropriate.   Past Medical History:  Diagnosis Date   Anxiety    Asthma    Depression    Thyroid disease      Past Surgical History:  Procedure Laterality Date   APPENDECTOMY     COLPOSCOPY       Family History  Problem Relation Age of Onset   Thyroid disease Mother    Hypertension Mother    Heart attack Mother    Anxiety disorder Sister    Depression Sister    Epilepsy Sister    Breast cancer Maternal Aunt    Colon cancer Maternal Aunt    Thyroid disease Maternal Grandmother    Hypertension Maternal Grandmother    Stroke Maternal Grandmother     Social History   Tobacco Use   Smoking status: Never   Smokeless tobacco: Never  Vaping Use   Vaping Use: Never used  Substance Use Topics   Alcohol use: Yes    Alcohol/week: 5.0 - 8.0 standard drinks    Types: 5 - 8 Glasses of wine per week    Comment: cocktails and beers are occasional    Drug use: Never    Review of Systems:   Review of Systems  Constitutional:  Negative for chills, fever, malaise/fatigue and weight loss.  HENT:  Negative for hearing loss, sinus pain and sore throat.   Respiratory:  Negative for cough and hemoptysis.   Cardiovascular:  Negative for chest pain, palpitations, leg swelling and PND.  Gastrointestinal:  Negative for abdominal pain, constipation, diarrhea, heartburn, nausea and vomiting.  Genitourinary:  Negative for dysuria, frequency and urgency.  Musculoskeletal:  Negative for back pain, myalgias and neck pain.  Skin:  Negative for itching and  rash.  Neurological:  Negative for dizziness, tingling, seizures and headaches.  Endo/Heme/Allergies:  Negative for polydipsia.  Psychiatric/Behavioral:  Negative for depression. The patient is not nervous/anxious.    Objective:   BP 125/80   Pulse 63   Temp 97.7 F (36.5 C)   Ht 5\' 6"  (1.676 m)   Wt 196 lb (88.9 kg)   SpO2 97%   BMI 31.64 kg/m  Body mass index is 31.64 kg/m.   General Appearance:    Alert, cooperative, no distress, appears stated age  Head:    Normocephalic, without obvious abnormality, atraumatic  Eyes:    PERRL, conjunctiva/corneas clear, EOM's intact, fundi    benign, both  eyes  Ears:    Normal TM's and external ear canals, both ears  Nose:   Nares normal, septum midline, mucosa normal, no drainage    or sinus tenderness  Throat:   Lips, mucosa, and tongue normal; teeth and gums normal  Neck:   Supple, symmetrical, trachea midline, no adenopathy;    thyroid:  no enlargement/tenderness/nodules; no carotid   bruit or JVD  Back:     Symmetric, no curvature, ROM normal, no CVA tenderness  Lungs:     Clear to auscultation bilaterally, respirations unlabored  Chest Wall:    No tenderness or deformity   Heart:    Regular rate and rhythm, S1 and S2 normal, no murmur, rub or gallop  Breast Exam:    Deferred  Abdomen:     Soft, non-tender, bowel sounds active all four quadrants,    no masses, no organomegaly  Genitalia:    Deferred  Extremities:   Extremities normal, atraumatic, no cyanosis or edema  Pulses:   2+ and symmetric all extremities  Skin:   Skin color, texture, turgor normal, no rashes or lesions  Lymph nodes:   Cervical, supraclavicular, and axillary nodes normal  Neurologic:   CNII-XII intact, normal strength, sensation and reflexes    throughout    Assessment/Plan:   Routine physical examination with abnormal findings Today patient counseled on age appropriate routine health concerns for screening and prevention, each reviewed and up to date  or declined. Immunizations reviewed and up to date or declined. Labs ordered and reviewed. Risk factors for depression reviewed and negative. Hearing function and visual acuity are intact. ADLs screened and addressed as needed. Functional ability and level of safety reviewed and appropriate. Education, counseling and referrals performed based on assessed risks today. Patient provided with a copy of personalized plan for preventive services.   Mild intermittent asthma without complication Stable Continue singulair 10 mg daily  May use albuterol 108 mcg base inhaler as needed  Refill Albuterol today Follow up if new/worsening symptoms or concerns occur  Depression, major, single episode, mild (HCC) Controlled  No red flags; Denies SI/HI Continue lexapro 10 mg daily, Start Wellbutrin XL 150 mg daily  I advised patient that if they develop any SI, to tell someone immediately and seek medical attention  Follow up in 3 months, sooner if concerns    GAD (generalized anxiety disorder) Controlled No red flags;Denies SI/HI Continue Ativan 0.5 mg twice daily as needed   I advised patient that if they develop any SI, to tell someone immediately and seek medical attention Follow up as needed   Obesity, unspecified classification, unspecified obesity type, unspecified whether serious comorbidity present Recommended patient gradually work on alcohol reduction  Start Wellbutrin 150 mg XL daily Will add naltrexone -- await blood work results to determine dosage to send in Encouraged patient to participate in healthy eating and increase regular exercise   Acquired hypothyroidism Controlled Continue synthroid 150 mcg daily and cytomel 10 mg daily  Management per Dr. Gershon Crane, endocrinology   Encounter for screening for other viral diseases  - Hepatitis C Antibody   Special screening for malignant neoplasms, colon Ordered colonoscopy for routine screening   LE swelling Recommend trialing  lasix 20 mg prn, salt reduction, leg elevation Consider compression hose when travel or during winter months If requiring frequent use of lasix or no improvement, recommend follow-up  Patient Counseling: [x]    Nutrition: Stressed importance of moderation in sodium/caffeine intake, saturated fat and cholesterol, caloric balance, sufficient intake  of fresh fruits, vegetables, fiber, calcium, iron, and 1 mg of folate supplement per day (for females capable of pregnancy).  [x]    Stressed the importance of regular exercise.   [x]    Substance Abuse: Discussed cessation/primary prevention of tobacco, alcohol, or other drug use; driving or other dangerous activities under the influence; availability of treatment for abuse.   [x]    Injury prevention: Discussed safety belts, safety helmets, smoke detector, smoking near bedding or upholstery.   [x]    Sexuality: Discussed sexually transmitted diseases, partner selection, use of condoms, avoidance of unintended pregnancy  and contraceptive alternatives.  [x]    Dental health: Discussed importance of regular tooth brushing, flossing, and dental visits.  [x]    Health maintenance and immunizations reviewed. Please refer to Health maintenance section.   I,Havlyn C Ratchford,acting as a Neurosurgeon for [employer], PA.,have documented all relevant documentation on the behalf of Jarold Motto, PA,as directed by  Jarold Motto, PA while in the presence of Jarold Motto, [place].  I, Jarold Motto, [place], have reviewed all documentation for this visit. The documentation on 12/25/21 for the exam, diagnosis, procedures, and orders are all accurate and complete.   Jarold Motto, PA-C [facility]

## 2021-12-26 LAB — HEPATITIS C ANTIBODY
Hepatitis C Ab: NONREACTIVE
SIGNAL TO CUT-OFF: <0.02 (ref <1.00)

## 2021-12-27 ENCOUNTER — Other Ambulatory Visit: Payer: Self-pay | Admitting: Physician Assistant

## 2021-12-27 MED ORDER — NALTREXONE HCL 50 MG PO TABS: ORAL_TABLET | ORAL | 1 refills | Status: DC

## 2022-03-22 ENCOUNTER — Other Ambulatory Visit: Payer: Self-pay | Admitting: Physician Assistant

## 2022-06-12 ENCOUNTER — Other Ambulatory Visit: Payer: Self-pay | Admitting: Physician Assistant

## 2022-06-17 ENCOUNTER — Encounter: Payer: Self-pay | Admitting: *Deleted

## 2022-08-18 ENCOUNTER — Other Ambulatory Visit: Payer: Self-pay | Admitting: Physician Assistant

## 2022-09-05 ENCOUNTER — Encounter: Payer: Self-pay | Admitting: *Deleted

## 2022-09-06 ENCOUNTER — Other Ambulatory Visit: Payer: Self-pay | Admitting: Physician Assistant

## 2022-09-25 IMAGING — MG DIGITAL DIAGNOSTIC BILAT W/ TOMO W/ CAD
6 of 10 series · 6 of 30 positions shown · non-contrast
Comparison: Previous exam(s).

CLINICAL DATA: 45-year-old female recalled from screening mammogram
dated 11/21/2021 for possible bilateral asymmetries.

EXAM:
DIGITAL DIAGNOSTIC BILATERAL MAMMOGRAM WITH TOMOSYNTHESIS AND CAD
TECHNIQUE: Bilateral digital diagnostic mammography and breast tomosynthesis
was performed. The images were evaluated with computer-aided
detection.

[R ML synth-2D]
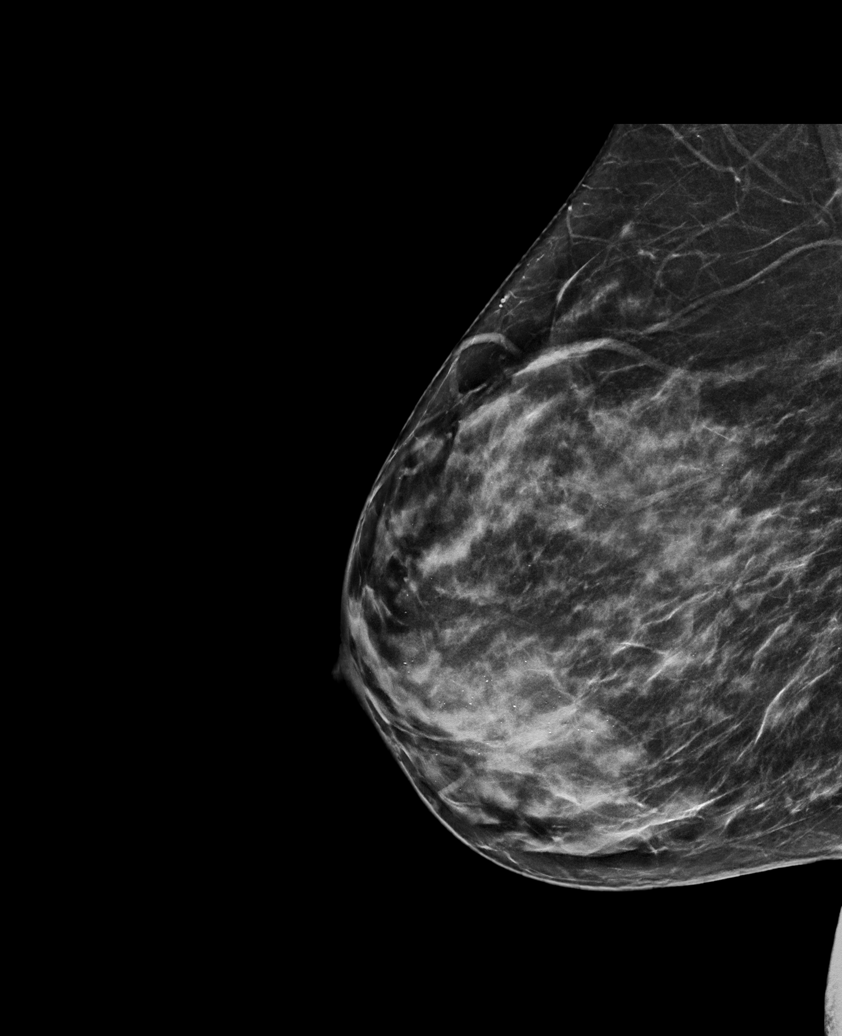

[L ML synth-2D]
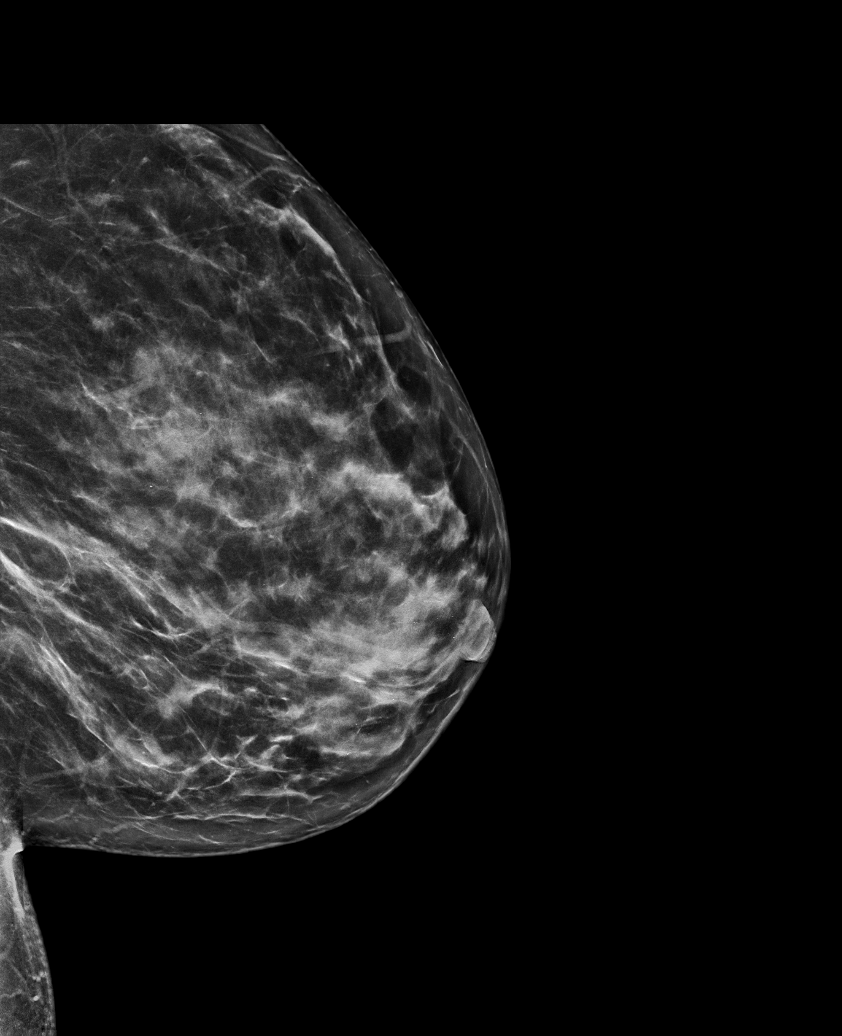

[L MLO synth-2D (1 of 2)]
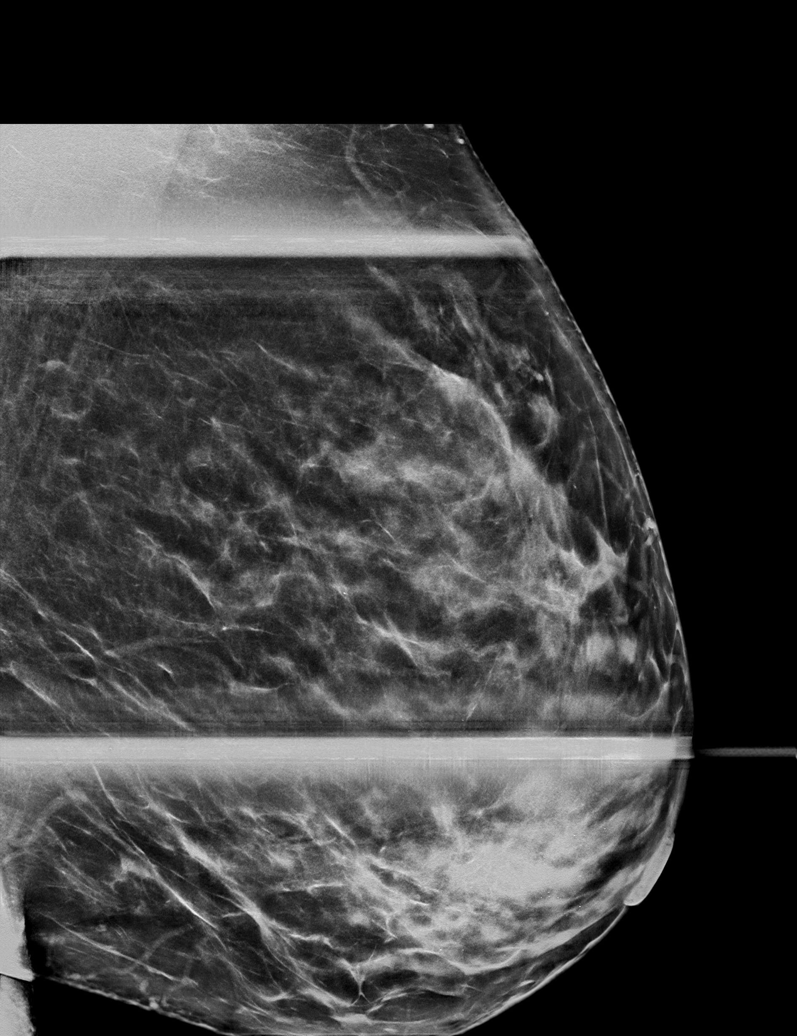

[R MLO synth-2D]
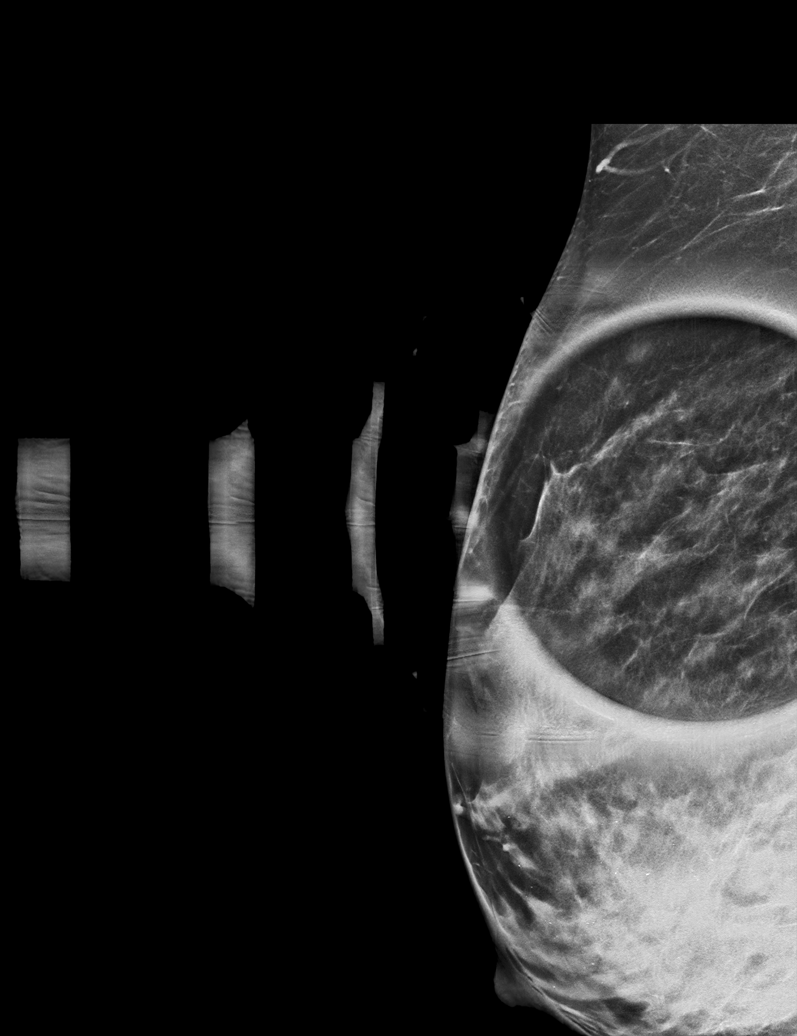

[L MLO synth-2D (2 of 2)]
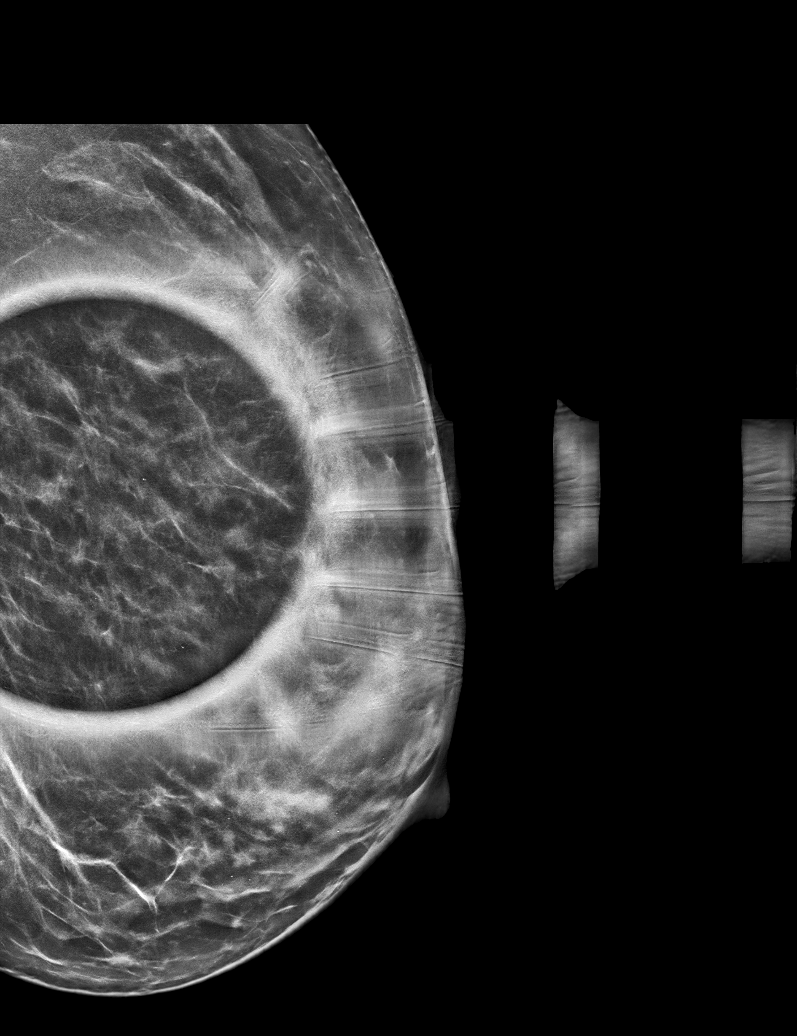

[L ML tomo · tomo slice 38/75.0]
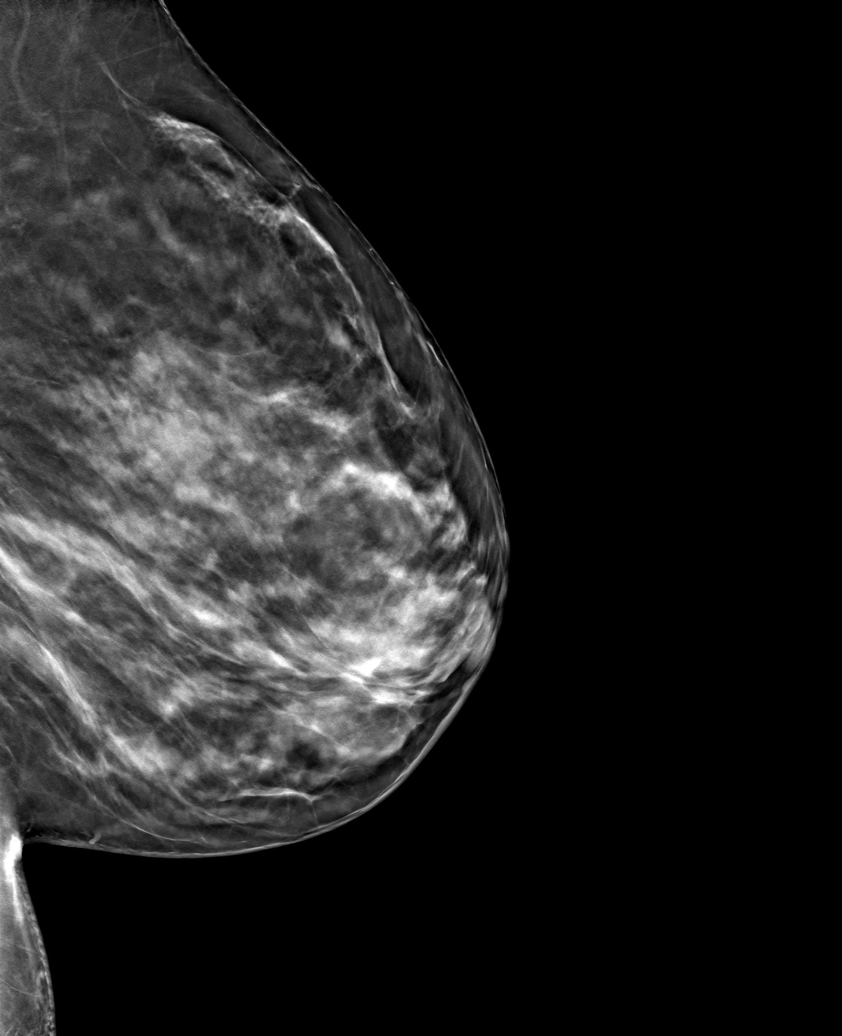

[6 of 30 positions shown; findings below may reference images not displayed]

ACR Breast Density Category c: The breast tissue is heterogeneously
dense, which may obscure small masses.
FINDINGS: Previously identified, possible asymmetries in the upper breast as
seen on the MLO projections bilaterally resolve into well dispersed
fibroglandular tissue on additional views. No suspicious findings
are identified.
IMPRESSION: No mammographic evidence of malignancy in either breast.

RECOMMENDATION:
Screening mammogram in one year.(Code:[id number]-[id number])

I have discussed the findings and recommendations with the patient.
If applicable, a reminder letter will be sent to the patient
regarding the next appointment.

BI-RADS CATEGORY  1: Negative.

## 2022-10-04 ENCOUNTER — Other Ambulatory Visit: Payer: Self-pay | Admitting: Physician Assistant

## 2022-10-27 ENCOUNTER — Other Ambulatory Visit: Payer: Self-pay | Admitting: Physician Assistant

## 2023-02-01 ENCOUNTER — Other Ambulatory Visit: Payer: Self-pay | Admitting: Physician Assistant

## 2023-02-14 ENCOUNTER — Other Ambulatory Visit (HOSPITAL_BASED_OUTPATIENT_CLINIC_OR_DEPARTMENT_OTHER): Payer: Self-pay

## 2023-02-14 DIAGNOSIS — Z1231 Encounter for screening mammogram for malignant neoplasm of breast: Secondary | ICD-10-CM

## 2023-02-15 ENCOUNTER — Other Ambulatory Visit: Payer: Self-pay | Admitting: Physician Assistant

## 2023-02-15 ENCOUNTER — Inpatient Hospital Stay (HOSPITAL_BASED_OUTPATIENT_CLINIC_OR_DEPARTMENT_OTHER): Admission: RE | Admit: 2023-02-15 | Payer: 59 | Source: Ambulatory Visit | Admitting: Radiology

## 2023-02-15 DIAGNOSIS — Z1231 Encounter for screening mammogram for malignant neoplasm of breast: Secondary | ICD-10-CM

## 2023-02-18 MED ORDER — ALBUTEROL SULFATE HFA 108 (90 BASE) MCG/ACT IN AERS
2.0000 | INHALATION_SPRAY | Freq: Four times a day (QID) | RESPIRATORY_TRACT | 0 refills | Status: DC | PRN
Start: 2023-02-18 — End: 2024-07-26

## 2023-02-18 MED ORDER — BUPROPION HCL ER (XL) 150 MG PO TB24: 150.0000 mg | ORAL_TABLET | Freq: Every day | ORAL | 0 refills | Status: DC

## 2023-02-18 MED ORDER — LORAZEPAM 0.5 MG PO TABS: 0.5000 mg | ORAL_TABLET | Freq: Two times a day (BID) | ORAL | 0 refills | Status: DC | PRN

## 2023-02-18 NOTE — Telephone Encounter (Signed)
Last refill: 07/18/21 @20 ,0 Last OV: 12/25/21 dx. CPE, suppose to f/u 3 months later Next OV: 03/03/23

## 2023-02-23 ENCOUNTER — Other Ambulatory Visit (HOSPITAL_BASED_OUTPATIENT_CLINIC_OR_DEPARTMENT_OTHER): Payer: Self-pay

## 2023-02-23 ENCOUNTER — Inpatient Hospital Stay (HOSPITAL_BASED_OUTPATIENT_CLINIC_OR_DEPARTMENT_OTHER): Admission: RE | Admit: 2023-02-23 | Payer: 59 | Source: Ambulatory Visit | Admitting: Radiology

## 2023-02-23 DIAGNOSIS — Z1231 Encounter for screening mammogram for malignant neoplasm of breast: Secondary | ICD-10-CM

## 2023-03-03 ENCOUNTER — Ambulatory Visit (INDEPENDENT_AMBULATORY_CARE_PROVIDER_SITE_OTHER): Payer: 59 | Admitting: Physician Assistant

## 2023-03-03 ENCOUNTER — Encounter: Payer: Self-pay | Admitting: Physician Assistant

## 2023-03-03 VITALS — BP 118/80 | HR 71 | Temp 97.8°F | Ht 66.0 in | Wt 176.0 lb

## 2023-03-03 DIAGNOSIS — Z23 Encounter for immunization: Secondary | ICD-10-CM

## 2023-03-03 DIAGNOSIS — F411 Generalized anxiety disorder: Secondary | ICD-10-CM

## 2023-03-03 DIAGNOSIS — Z1211 Encounter for screening for malignant neoplasm of colon: Secondary | ICD-10-CM

## 2023-03-03 DIAGNOSIS — Z13220 Encounter for screening for lipoid disorders: Secondary | ICD-10-CM | POA: Diagnosis not present

## 2023-03-03 DIAGNOSIS — F320 Major depressive disorder, single episode, mild: Secondary | ICD-10-CM

## 2023-03-03 DIAGNOSIS — E663 Overweight: Secondary | ICD-10-CM

## 2023-03-03 DIAGNOSIS — Z0000 Encounter for general adult medical examination without abnormal findings: Secondary | ICD-10-CM

## 2023-03-03 DIAGNOSIS — E039 Hypothyroidism, unspecified: Secondary | ICD-10-CM | POA: Diagnosis not present

## 2023-03-03 DIAGNOSIS — Z8249 Family history of ischemic heart disease and other diseases of the circulatory system: Secondary | ICD-10-CM

## 2023-03-03 DIAGNOSIS — M7989 Other specified soft tissue disorders: Secondary | ICD-10-CM

## 2023-03-03 DIAGNOSIS — J4520 Mild intermittent asthma, uncomplicated: Secondary | ICD-10-CM

## 2023-03-03 LAB — COMPREHENSIVE METABOLIC PANEL
ALT: 7 U/L (ref 0–35)
AST: 13 U/L (ref 0–37)
Albumin: 4.2 g/dL (ref 3.5–5.2)
Alkaline Phosphatase: 59 U/L (ref 39–117)
BUN: 11 mg/dL (ref 6–23)
CO2: 24 mEq/L (ref 19–32)
Calcium: 9 mg/dL (ref 8.4–10.5)
Chloride: 102 mEq/L (ref 96–112)
Creatinine, Ser: 0.74 mg/dL (ref 0.40–1.20)
GFR: 96.93 mL/min (ref >60.00)
Glucose, Bld: 95 mg/dL (ref 70–99)
Potassium: 4.4 mEq/L (ref 3.5–5.1)
Sodium: 135 mEq/L (ref 135–145)
Total Bilirubin: 0.7 mg/dL (ref 0.2–1.2)
Total Protein: 6.8 g/dL (ref 6.0–8.3)

## 2023-03-03 LAB — CBC WITH DIFFERENTIAL/PLATELET
Basophils Absolute: 0 10*3/uL (ref 0.0–0.1)
Basophils Relative: 0.5 % (ref 0.0–3.0)
Eosinophils Absolute: 0 10*3/uL (ref 0.0–0.7)
Eosinophils Relative: 0.5 % (ref 0.0–5.0)
HCT: 41.6 % (ref 36.0–46.0)
Hemoglobin: 13.9 g/dL (ref 12.0–15.0)
Lymphocytes Relative: 29.5 % (ref 12.0–46.0)
Lymphs Abs: 1.8 10*3/uL (ref 0.7–4.0)
MCHC: 33.3 g/dL (ref 30.0–36.0)
MCV: 89.4 fl (ref 78.0–100.0)
Monocytes Absolute: 0.4 10*3/uL (ref 0.1–1.0)
Monocytes Relative: 6.9 % (ref 3.0–12.0)
Neutro Abs: 3.8 10*3/uL (ref 1.4–7.7)
Neutrophils Relative %: 62.6 % (ref 43.0–77.0)
Platelets: 323 10*3/uL (ref 150.0–400.0)
RBC: 4.66 Mil/uL (ref 3.87–5.11)
RDW: 12.1 % (ref 11.5–15.5)
WBC: 6.1 10*3/uL (ref 4.0–10.5)

## 2023-03-03 LAB — LIPID PANEL
Cholesterol: 138 mg/dL (ref 0–200)
HDL: 71.3 mg/dL (ref >39.00)
LDL Cholesterol: 50 mg/dL (ref 0–99)
NonHDL: 66.39
Total CHOL/HDL Ratio: 2
Triglycerides: 84 mg/dL (ref 0.0–149.0)
VLDL: 16.8 mg/dL (ref 0.0–40.0)

## 2023-03-03 NOTE — Progress Notes (Signed)
Subjective:    Cynthia Casey is a 47 y.o. female and is here for a comprehensive physical exam.  HPI  Health Maintenance Due  Topic Date Due   Colonoscopy  Never done   DTaP/Tdap/Td (2 - Td or Tdap) 09/30/2022    Acute Concerns: None  Chronic Issues: Leg swelling: She has been managing her leg swelling with 20 mg Lasix.  She mostly tends to take her Lasix when she is traveling for work.   Depression/Anxiety: Her mood is overall stable, no concerns.  Takes Wellbutrin 150 mg daily. She takes 50 mg Trazodone most nights.  She has not needed to take her 0.5 mg Ativan often.  Denies SI/HI.  Asthma: She takes her albuterol as needed, when she feels ill.  Overall she reports not needing it often.  Hypothyroidism Currently seeing endocrinology at [facility] Maintenance: Immunizations -- UTD. Received Tetanus 09/30/2022. Colonoscopy -- She is receptive to scheduling a colonoscopy.  Mammogram -- Last done 12/04/2021. Results were normal. PAP -- Last done 11/24/2021. Results were normal. Bone Density -- N/A Diet -- Healthy overall. Plenty of water, no soda/juice.  Exercise -- Maintains regular exercise when at home. She finds it difficult to consistently exercise when traveling, though she notes she tends to walk more on work trips.   Sleep habits -- No concerns. Mood -- Stable.  UTD with dentist? - Yes UTD with eye doctor? - Yes, scheduled soon. Uses contacts.   Weight history: Wt Readings from Last 10 Encounters:  03/03/23 176 lb (79.8 kg)  12/25/21 196 lb (88.9 kg)  07/18/21 188 lb (85.3 kg)  01/29/21 182 lb (82.6 kg)  12/29/20 183 lb (83 kg)  12/13/20 182 lb (82.6 kg)  08/28/20 179 lb 6.4 oz (81.4 kg)   Body mass index is 28.41 kg/m. No LMP recorded. (Menstrual status: IUD).  Alcohol use:  reports current alcohol use of about 3.0 - 4.0 standard drinks of alcohol per week. 3-4 drinks a week.  Tobacco use:  Tobacco Use: Low Risk  (03/03/2023)   Patient  History    Smoking Tobacco Use: Never    Smokeless Tobacco Use: Never    Passive Exposure: Not on file   Eligible for lung cancer screening? no     03/03/2023   10:13 AM  Depression screen PHQ 2/9  Decreased Interest 0  Down, Depressed, Hopeless 0  PHQ - 2 Score 0  Altered sleeping 1  Tired, decreased energy 1  Change in appetite 0  Feeling bad or failure about yourself  0  Trouble concentrating 1  Moving slowly or fidgety/restless 0  Suicidal thoughts 0  PHQ-9 Score 3  Difficult doing work/chores Not difficult at all     Other providers/specialists: Patient Care Team: Jarold Motto, [place] as PCP - General (Physician Assistant)    PMHx, SurgHx, SocialHx, Medications, and Allergies were reviewed in the Visit Navigator and updated as appropriate.   Past Medical History:  Diagnosis Date   Allergy    Anxiety    Asthma    Depression    Thyroid disease      Past Surgical History:  Procedure Laterality Date   APPENDECTOMY     COLPOSCOPY       Family History  Problem Relation Age of Onset   Thyroid disease Mother    Hypertension Mother        well-controlled   Heart attack Mother        second; stent x 2   Anxiety  disorder Mother    Depression Mother    Anxiety disorder Sister    Depression Sister    Epilepsy Sister    Alcohol abuse Sister    ADD / ADHD Brother    Thyroid disease Maternal Grandmother    Hypertension Maternal Grandmother    Stroke Maternal Grandmother    Breast cancer Maternal Aunt    Colon cancer Maternal Aunt     Social History   Tobacco Use   Smoking status: Never   Smokeless tobacco: Never  Vaping Use   Vaping Use: Never used  Substance Use Topics   Alcohol use: Yes    Alcohol/week: 3.0 - 4.0 standard drinks of alcohol    Types: 3 - 4 Glasses of wine per week    Comment: cocktails and beers are occasional    Drug use: Never    Review of Systems:   Review of Systems  Constitutional:  Negative for chills, fever,  malaise/fatigue and weight loss.  HENT:  Negative for hearing loss, sinus pain and sore throat.   Respiratory:  Negative for cough and hemoptysis.   Cardiovascular:  Negative for chest pain, palpitations, leg swelling and PND.  Gastrointestinal:  Negative for abdominal pain, constipation, diarrhea, heartburn, nausea and vomiting.  Genitourinary:  Negative for dysuria, frequency and urgency.  Musculoskeletal:  Negative for back pain, myalgias and neck pain.  Skin:  Negative for itching and rash.  Neurological:  Negative for dizziness, tingling, seizures and headaches.  Endo/Heme/Allergies:  Negative for polydipsia.  Psychiatric/Behavioral:  Negative for depression. The patient is not nervous/anxious.     Objective:   BP 118/80 (BP Location: Left Arm, Patient Position: Sitting, Cuff Size: Normal)   Pulse 71   Temp 97.8 F (36.6 C) (Temporal)   Ht 5\' 6"  (1.676 m)   Wt 176 lb (79.8 kg)   SpO2 97%   BMI 28.41 kg/m  Body mass index is 28.41 kg/m.   General Appearance:    Alert, cooperative, no distress, appears stated age  Head:    Normocephalic, without obvious abnormality, atraumatic  Eyes:    PERRL, conjunctiva/corneas clear, EOM's intact, fundi    benign, both eyes  Ears:    Normal TM's and external ear canals, both ears  Nose:   Nares normal, septum midline, mucosa normal, no drainage    or sinus tenderness  Throat:   Lips, mucosa, and tongue normal; teeth and gums normal  Neck:   Supple, symmetrical, trachea midline, no adenopathy;    thyroid:  no enlargement/tenderness/nodules; no carotid   bruit or JVD  Back:     Symmetric, no curvature, ROM normal, no CVA tenderness  Lungs:     Clear to auscultation bilaterally, respirations unlabored  Chest Wall:    No tenderness or deformity   Heart:    Regular rate and rhythm, S1 and S2 normal, no murmur, rub or gallop  Breast Exam:    Deferred  Abdomen:     Soft, non-tender, bowel sounds active all four quadrants,    no masses, no  organomegaly  Genitalia:    Deferred  Extremities:   Extremities normal, atraumatic, no cyanosis or edema  Pulses:   2+ and symmetric all extremities  Skin:   Skin color, texture, turgor normal, no rashes or lesions  Lymph nodes:   Cervical, supraclavicular, and axillary nodes normal  Neurologic:   CNII-XII intact, normal strength, sensation and reflexes    throughout    Assessment/Plan:   Routine physical examination Today patient  counseled on age appropriate routine health concerns for screening and prevention, each reviewed and up to date or declined. Immunizations reviewed and up to date or declined. Labs ordered and reviewed. Risk factors for depression reviewed and negative. Hearing function and visual acuity are intact. ADLs screened and addressed as needed. Functional ability and level of safety reviewed and appropriate. Education, counseling and referrals performed based on assessed risks today. Patient provided with a copy of personalized plan for preventive services.  Depression, major, single episode, mild (HCC); GAD (generalized anxiety disorder) No red flags Continue Wellbutrin 150 mg daily and trazodone 50 mg nightly as needed Ativan 0.5 mg daily as needed rarely used Follow-up yearly, sooner if concerns I discussed with patient that if they develop any SI, to tell someone immediately and seek medical attention.  Acquired hypothyroidism Management per endo  Overweight Improving Continue healthy lifestyle  Special screening for malignant neoplasms, colon Colonoscopy ordered  Family history of myocardial infarction Will order calcium score and update lipid panel  Leg swelling Lasix 20 mg as needed  Denies new/worsening symptom(s) Continue to monitor  Mild intermittent asthma without complication Albuterol as needed Follow-up as needed if new/worsening symptom(s) or requires frequent use of inhaler  Need for prophylactic vaccination with combined  diphtheria-tetanus-pertussis (DTP) vaccine Updated today   I,Rachel Rivera,acting as a scribe for [employer], PA.,have documented all relevant documentation on the behalf of Jarold Motto, PA,as directed by  Jarold Motto, PA while in the presence of Jarold Motto, [place].   I, Jarold Motto, [place], have reviewed all documentation for this visit. The documentation on 03/03/23 for the exam, diagnosis, procedures, and orders are all accurate and complete.   Jarold Motto, PA-C [facility]

## 2023-03-03 NOTE — Patient Instructions (Addendum)
It was great to see you!  We will order a calcium score test -- someone will contact you to schedule this  Please go to the lab for blood work.   Our office will call you with your results unless you have chosen to receive results via MyChart.  If your blood work is normal we will follow-up each year for physicals and as scheduled for chronic medical problems.  If anything is abnormal we will treat accordingly and get you in for a follow-up.  Take care,  Lelon Mast

## 2023-05-18 ENCOUNTER — Other Ambulatory Visit: Payer: Self-pay | Admitting: Physician Assistant

## 2023-06-02 ENCOUNTER — Other Ambulatory Visit: Payer: Self-pay | Admitting: Physician Assistant

## 2023-06-03 ENCOUNTER — Other Ambulatory Visit: Payer: Self-pay | Admitting: *Deleted

## 2023-06-03 MED ORDER — BUPROPION HCL ER (XL) 150 MG PO TB24: 150.0000 mg | ORAL_TABLET | Freq: Every day | ORAL | 1 refills | Status: DC

## 2023-08-24 ENCOUNTER — Other Ambulatory Visit: Payer: Self-pay | Admitting: Physician Assistant

## 2023-12-21 ENCOUNTER — Other Ambulatory Visit: Payer: Self-pay | Admitting: Physician Assistant

## 2024-02-23 ENCOUNTER — Other Ambulatory Visit: Payer: Self-pay | Admitting: Physician Assistant

## 2024-02-23 DIAGNOSIS — Z1231 Encounter for screening mammogram for malignant neoplasm of breast: Secondary | ICD-10-CM

## 2024-02-27 ENCOUNTER — Ambulatory Visit
Admission: RE | Admit: 2024-02-27 | Discharge: 2024-02-27 | Disposition: A | Discharge status: Home / Self Care | Source: Ambulatory Visit

## 2024-02-27 DIAGNOSIS — Z1231 Encounter for screening mammogram for malignant neoplasm of breast: Secondary | ICD-10-CM

## 2024-03-24 ENCOUNTER — Other Ambulatory Visit: Payer: Self-pay | Admitting: Physician Assistant

## 2024-04-18 ENCOUNTER — Other Ambulatory Visit: Payer: Self-pay | Admitting: Physician Assistant

## 2024-05-21 ENCOUNTER — Ambulatory Visit (INDEPENDENT_AMBULATORY_CARE_PROVIDER_SITE_OTHER): Admitting: Physician Assistant

## 2024-05-21 ENCOUNTER — Encounter: Payer: Self-pay | Admitting: Physician Assistant

## 2024-05-21 VITALS — BP 110/76 | HR 72 | Temp 97.3°F | Ht 66.0 in | Wt 166.4 lb

## 2024-05-21 DIAGNOSIS — G4700 Insomnia, unspecified: Secondary | ICD-10-CM

## 2024-05-21 DIAGNOSIS — R41840 Attention and concentration deficit: Secondary | ICD-10-CM

## 2024-05-21 DIAGNOSIS — F411 Generalized anxiety disorder: Secondary | ICD-10-CM

## 2024-05-21 DIAGNOSIS — F320 Major depressive disorder, single episode, mild: Secondary | ICD-10-CM

## 2024-05-21 DIAGNOSIS — Z1211 Encounter for screening for malignant neoplasm of colon: Secondary | ICD-10-CM | POA: Diagnosis not present

## 2024-05-21 MED ORDER — ESCITALOPRAM OXALATE 10 MG PO TABS: 10.0000 mg | ORAL_TABLET | Freq: Every day | ORAL | 1 refills | Status: AC

## 2024-05-21 MED ORDER — LORAZEPAM 0.5 MG PO TABS: 0.5000 mg | ORAL_TABLET | Freq: Two times a day (BID) | ORAL | 0 refills | Status: DC | PRN

## 2024-05-21 NOTE — Patient Instructions (Signed)
 1-[facility] Address: [address] [place], [place], [place] [address] Phone: [phone]  [facility] at [facility] ** [facility] place referral here)  [address] [place], [place] [address] Phone: [phone]  3- [facility], [facility] Address: [address] [place], [place] [address] Phone: [phone]

## 2024-05-21 NOTE — Progress Notes (Signed)
 Cynthia Casey is a 48 y.o. female here for a follow up of a pre-existing problem.  History of Present Illness:   Chief Complaint  Patient presents with   Anxiety/Depression    Pt having issues since she stopped Wellbutrin  and Lexapro  6 months ago. Pt would like to discuss going back on medications.    Discussed the use of AI scribe software for clinical note transcription with the patient, who gave verbal consent to proceed.  History of Present Illness Cynthia Casey is a 48 year old female with depression and anxiety who presents with a request to restart antidepressant medication.  She discontinued Lexapro , attributing her stress and anxiety to situational factors, and is interested in restarting it. Previously, she used Wellbutrin , which she found beneficial overall but less effective for anxiety. Currently, she takes trazodone  nightly for sleep, which aids in falling asleep but not in maintaining sleep, as she often wakes at 3 AM with racing thoughts.   She is concerned about ADHD, identifying with symptoms common in women her age, and has a family history of ADHD in her brother and son. She has Hashimoto's thyroiditis, recently experiencing a flare with a TSH level in the sixties, leading to an increased medication dosage and a follow-up scheduled for September.    Past Medical History:  Diagnosis Date   Allergy    Anxiety    Asthma    Depression    Thyroid disease      Social History   Tobacco Use   Smoking status: Never   Smokeless tobacco: Never  Vaping Use   Vaping status: Never Used  Substance Use Topics   Alcohol use: Yes    Alcohol/week: 3.0 - 4.0 standard drinks of alcohol    Types: 3 - 4 Glasses of wine per week    Comment: cocktails and beers are occasional    Drug use: Never    Past Surgical History:  Procedure Laterality Date   APPENDECTOMY     COLPOSCOPY      Family History  Problem Relation Age of Onset   Thyroid disease Mother    Hypertension  Mother        well-controlled   Heart attack Mother        second; stent x 2   Anxiety disorder Mother    Depression Mother    Anxiety disorder Sister    Depression Sister    Epilepsy Sister    Alcohol abuse Sister    ADD / ADHD Brother    Thyroid disease Maternal Grandmother    Hypertension Maternal Grandmother    Stroke Maternal Grandmother    Breast cancer Maternal Aunt    Colon cancer Maternal Aunt     Allergies  Allergen Reactions   Penicillins Other (See Comments)    Pt states this was a childhood allergy.     Current Medications:   Current Outpatient Medications:    acyclovir  (ZOVIRAX ) 800 MG tablet, TAKE 1 TABLET BY MOUTH 3 TIMES DAILY AS NEEDED., Disp: 30 tablet, Rfl: 1   albuterol  (VENTOLIN  HFA) 108 (90 Base) MCG/ACT inhaler, Inhale 2 puffs into the lungs every 6 (six) hours as needed for wheezing or shortness of breath., Disp: 8 g, Rfl: 0   furosemide  (LASIX ) 20 MG tablet, TAKE 1 TABLET (20 MG TOTAL) BY MOUTH DAILY AS NEEDED FOR FLUID., Disp: 90 tablet, Rfl: 1   levonorgestrel  (MIRENA ) 20 MCG/24HR IUD, 1 each by Intrauterine route once. Inserted 12/29/2020, Disp: , Rfl:  levothyroxine (SYNTHROID ) 150 MCG tablet, Take 150 mcg by mouth daily before breakfast., Disp: , Rfl:    liothyronine (CYTOMEL) 5 MCG tablet, Take 10 mcg by mouth daily., Disp: , Rfl:    LORazepam  (ATIVAN ) 0.5 MG tablet, Take 1 tablet (0.5 mg total) by mouth 2 (two) times daily as needed for anxiety., Disp: 20 tablet, Rfl: 0   montelukast  (SINGULAIR ) 10 MG tablet, TAKE 1 TABLET BY MOUTH EVERY DAY, Disp: 90 tablet, Rfl: 0   tirzepatide 10 MG/0.5ML injection vial, Inject into the skin once a week. Pt is currently taking 5 mg weekly, Disp: , Rfl:    traZODone  (DESYREL ) 50 MG tablet, TAKE 1 TABLET BY MOUTH EVERYDAY AT BEDTIME, Disp: 90 tablet, Rfl: 0   buPROPion  (WELLBUTRIN  XL) 150 MG 24 hr tablet, Take 1 tablet (150 mg total) by mouth daily. (Patient not taking: Reported on 05/21/2024), Disp: 90 tablet,  Rfl: 1   Review of Systems:   Negative unless otherwise specified per HPI.  Vitals:   Vitals:   05/21/24 1146  BP: 110/76  Pulse: 72  Temp: (!) 97.3 F (36.3 C)  TempSrc: Temporal  SpO2: 97%  Weight: 166 lb 6.1 oz (75.5 kg)  Height: 5' 6 (1.676 m)     Body mass index is 26.85 kg/m.  Physical Exam:   Physical Exam  Assessment and Plan:   Assessment and Plan Assessment & Plan Depression, major, single episode and Generalized Anxiety Disorder She discontinued Wellbutrin  due to limited anxiety relief. Interested in restarting Lexapro  for depression and anxiety management. No active suicidal ideation, but acknowledges stress-related thoughts. - Restart Lexapro  10 mg daily. Monitor for symptom improvement over 4-6 weeks. Message us  in about a month to update us  on symptom(s).  - Refill Ativan  (lorazepam ) 0.5 mg as needed for anxiety.  Insomnia Trazodone  aids sleep onset but not maintenance. - Monitor sleep patterns after restarting Lexapro  to assess for improvement in insomnia.  Suspected Attention-Deficit Hyperactivity Disorder (ADHD) She suspects ADHD, aligns with symptoms, and has a family history. Seeks evaluation. - Submit referral for ADHD evaluation.  Colon cancer screening No colonoscopy despite family history of colon cancer. - Resubmit referral for colonoscopy and provide scheduling information via MyChart.     [name], PA-C

## 2024-06-25 ENCOUNTER — Other Ambulatory Visit: Payer: Self-pay | Admitting: Physician Assistant

## 2024-07-26 ENCOUNTER — Ambulatory Visit (INDEPENDENT_AMBULATORY_CARE_PROVIDER_SITE_OTHER): Admitting: Physician Assistant

## 2024-07-26 VITALS — BP 120/76 | HR 60 | Temp 97.7°F | Ht 65.0 in | Wt 166.4 lb

## 2024-07-26 DIAGNOSIS — J4520 Mild intermittent asthma, uncomplicated: Secondary | ICD-10-CM

## 2024-07-26 DIAGNOSIS — E039 Hypothyroidism, unspecified: Secondary | ICD-10-CM

## 2024-07-26 DIAGNOSIS — F339 Major depressive disorder, recurrent, unspecified: Secondary | ICD-10-CM

## 2024-07-26 DIAGNOSIS — Z23 Encounter for immunization: Secondary | ICD-10-CM

## 2024-07-26 DIAGNOSIS — Z0000 Encounter for general adult medical examination without abnormal findings: Secondary | ICD-10-CM

## 2024-07-26 DIAGNOSIS — B001 Herpesviral vesicular dermatitis: Secondary | ICD-10-CM

## 2024-07-26 DIAGNOSIS — F411 Generalized anxiety disorder: Secondary | ICD-10-CM

## 2024-07-26 DIAGNOSIS — G4700 Insomnia, unspecified: Secondary | ICD-10-CM

## 2024-07-26 DIAGNOSIS — M7989 Other specified soft tissue disorders: Secondary | ICD-10-CM

## 2024-07-26 LAB — LIPID PANEL
Cholesterol: 152 mg/dL (ref 0–200)
HDL: 78 mg/dL (ref >39.00)
LDL Cholesterol: 61 mg/dL (ref 0–99)
NonHDL: 74.49
Total CHOL/HDL Ratio: 2
Triglycerides: 67 mg/dL (ref 0.0–149.0)
VLDL: 13.4 mg/dL (ref 0.0–40.0)

## 2024-07-26 LAB — COMPREHENSIVE METABOLIC PANEL WITH GFR
ALT: 7 U/L (ref 0–35)
AST: 12 U/L (ref 0–37)
Albumin: 4.1 g/dL (ref 3.5–5.2)
Alkaline Phosphatase: 43 U/L (ref 39–117)
BUN: 11 mg/dL (ref 6–23)
CO2: 26 meq/L (ref 19–32)
Calcium: 8.8 mg/dL (ref 8.4–10.5)
Chloride: 101 meq/L (ref 96–112)
Creatinine, Ser: 0.74 mg/dL (ref 0.40–1.20)
GFR: 95.98 mL/min (ref >60.00)
Glucose, Bld: 78 mg/dL (ref 70–99)
Potassium: 4.6 meq/L (ref 3.5–5.1)
Sodium: 136 meq/L (ref 135–145)
Total Bilirubin: 0.6 mg/dL (ref 0.2–1.2)
Total Protein: 6.6 g/dL (ref 6.0–8.3)

## 2024-07-26 LAB — CBC WITH DIFFERENTIAL/PLATELET
Basophils Absolute: 0 K/uL (ref 0.0–0.1)
Basophils Relative: 0.3 % (ref 0.0–3.0)
Eosinophils Absolute: 0 K/uL (ref 0.0–0.7)
Eosinophils Relative: 0.4 % (ref 0.0–5.0)
HCT: 42.7 % (ref 36.0–46.0)
Hemoglobin: 14.4 g/dL (ref 12.0–15.0)
Lymphocytes Relative: 24.3 % (ref 12.0–46.0)
Lymphs Abs: 2.3 K/uL (ref 0.7–4.0)
MCHC: 33.7 g/dL (ref 30.0–36.0)
MCV: 89.2 fl (ref 78.0–100.0)
Monocytes Absolute: 0.5 K/uL (ref 0.1–1.0)
Monocytes Relative: 5.9 % (ref 3.0–12.0)
Neutro Abs: 6.4 K/uL (ref 1.4–7.7)
Neutrophils Relative %: 69.1 % (ref 43.0–77.0)
Platelets: 281 K/uL (ref 150.0–400.0)
RBC: 4.79 Mil/uL (ref 3.87–5.11)
RDW: 12.7 % (ref 11.5–15.5)
WBC: 9.3 K/uL (ref 4.0–10.5)

## 2024-07-26 MED ORDER — ACYCLOVIR 800 MG PO TABS: 800.0000 mg | ORAL_TABLET | Freq: Three times a day (TID) | ORAL | 1 refills | Status: AC | PRN

## 2024-07-26 MED ORDER — LORAZEPAM 0.5 MG PO TABS: 0.5000 mg | ORAL_TABLET | Freq: Two times a day (BID) | ORAL | 0 refills | Status: AC | PRN

## 2024-07-26 MED ORDER — TRAZODONE HCL 100 MG PO TABS: 100.0000 mg | ORAL_TABLET | Freq: Every day | ORAL | 3 refills | Status: AC

## 2024-07-26 MED ORDER — ALBUTEROL SULFATE HFA 108 (90 BASE) MCG/ACT IN AERS: 2.0000 | INHALATION_SPRAY | Freq: Four times a day (QID) | RESPIRATORY_TRACT | 0 refills | Status: AC | PRN

## 2024-07-26 MED ORDER — FUROSEMIDE 20 MG PO TABS: 20.0000 mg | ORAL_TABLET | Freq: Every day | ORAL | 1 refills | Status: AC | PRN

## 2024-07-26 NOTE — Progress Notes (Signed)
 Subjective:    Cynthia Casey is a 48 y.o. female and is here for a comprehensive physical exam.  HPI  Health Maintenance Due  Topic Date Due   Colonoscopy  Never done   Discussed the use of AI scribe software for clinical note transcription with the patient, who gave verbal consent to proceed.  History of Present Illness   LETRICIA Casey is a 48 year old female who presents for follow-up regarding her mental health and perimenopausal symptoms.  She feels improved since resuming medication six weeks ago, despite a stressful job environment. She has scheduled her first therapy appointment for tomorrow and is on a waiting list for an ADHD assessment. Concerns about perimenopause or menopause are present, complicated by a Mirena  IUD, making menstrual cycle tracking difficult. Symptoms include fatigue, dry skin, and hair changes, with high TSH levels noted in the past six months.  Frequent headaches occur, often at the end of the workday, likely due to computer use and vision strain, but no severe or sudden headaches are reported. Sleep disturbances include waking at 3 AM with difficulty returning to sleep, improved with two doses of trazodone . Occasional use of Ativan  for anxiety and panic is noted. She manages fluid retention with furosemide , primarily during travel. Singulair  is taken for asthma and allergies.  She is not currently exercising due to job stress and struggles with balancing life responsibilities. She acknowledges potential impacts on executive functioning from ADHD or perimenopause. Her diet is generally healthy, though she aims to increase vegetable intake. She is dating a engineer, civil (consulting).      Health Maintenance: Immunizations -- UpToDate  Colonoscopy -- UpToDate  Mammogram -- UpToDate  PAP -- UpToDate  Bone Density -- n/a Diet -- yes Exercise -- not currently  Sleep habits -- overall has issues with staying asleep -- taking trazodone  nightly Mood -- stable  UTD with  dentist? - yes UTD with eye doctor? - yes  Weight history: Wt Readings from Last 10 Encounters:  07/26/24 166 lb 6.1 oz (75.5 kg)  05/21/24 166 lb 6.1 oz (75.5 kg)  03/03/23 176 lb (79.8 kg)  12/25/21 196 lb (88.9 kg)  07/18/21 188 lb (85.3 kg)  01/29/21 182 lb (82.6 kg)  12/29/20 183 lb (83 kg)  12/13/20 182 lb (82.6 kg)  08/28/20 179 lb 6.4 oz (81.4 kg)   Body mass index is 27.69 kg/m. No LMP recorded. (Menstrual status: IUD).  Alcohol use:  reports current alcohol use of about 2.0 standard drinks of alcohol per week.  Tobacco use:  Tobacco Use: Low Risk  (07/26/2024)   Patient History    Smoking Tobacco Use: Never    Smokeless Tobacco Use: Never    Passive Exposure: Not on file   Eligible for lung cancer screening? no     05/21/2024   12:03 PM  Depression screen PHQ 2/9  Decreased Interest 2  Down, Depressed, Hopeless 1  PHQ - 2 Score 3  Altered sleeping 3  Tired, decreased energy 3  Change in appetite 2  Feeling bad or failure about yourself  2  Trouble concentrating 3  Moving slowly or fidgety/restless 1  Suicidal thoughts 1  PHQ-9 Score 18  Difficult doing work/chores Very difficult     Other providers/specialists: Patient Care Team: Job Lukes, [place] as PCP - General (Physician Assistant)    PMHx, SurgHx, SocialHx, Medications, and Allergies were reviewed in the Visit Navigator and updated as appropriate.   Past Medical History:  Diagnosis Date  Allergy    Anxiety    Asthma    Depression    Thyroid disease      Past Surgical History:  Procedure Laterality Date   APPENDECTOMY     COLPOSCOPY       Family History  Problem Relation Age of Onset   Thyroid disease Mother    Hypertension Mother        well-controlled   Heart attack Mother        second; stent x 2   Anxiety disorder Mother    Depression Mother    Anxiety disorder Sister    Depression Sister    Epilepsy Sister    Alcohol abuse Sister    ADD / ADHD Brother     Thyroid disease Maternal Grandmother    Hypertension Maternal Grandmother    Stroke Maternal Grandmother    Breast cancer Maternal Aunt    Colon cancer Maternal Aunt    ADD / ADHD Son     Social History   Tobacco Use   Smoking status: Never   Smokeless tobacco: Never  Vaping Use   Vaping status: Never Used  Substance Use Topics   Alcohol use: Yes    Alcohol/week: 2.0 standard drinks of alcohol    Types: 1 Glasses of wine, 1 Standard drinks or equivalent per week    Comment: cocktails and beers are occasional    Drug use: Never    Review of Systems:   Review of Systems  Constitutional:  Negative for chills, fever, malaise/fatigue and weight loss.  HENT:  Negative for hearing loss, sinus pain and sore throat.   Respiratory:  Negative for cough and hemoptysis.   Cardiovascular:  Negative for chest pain, palpitations, leg swelling and PND.  Gastrointestinal:  Negative for abdominal pain, constipation, diarrhea, heartburn, nausea and vomiting.  Genitourinary:  Negative for dysuria, frequency and urgency.  Musculoskeletal:  Negative for back pain, myalgias and neck pain.  Skin:  Negative for itching and rash.  Neurological:  Negative for dizziness, tingling, seizures and headaches.  Endo/Heme/Allergies:  Negative for polydipsia.  Psychiatric/Behavioral:  Negative for depression. The patient is not nervous/anxious.     Objective:   BP 120/76 (BP Location: Left Arm, Patient Position: Sitting, Cuff Size: Normal)   Pulse 60   Temp 97.7 F (36.5 C) (Temporal)   Ht 5' 5 (1.651 m)   Wt 166 lb 6.1 oz (75.5 kg)   BMI 27.69 kg/m  Body mass index is 27.69 kg/m.   General Appearance:    Alert, cooperative, no distress, appears stated age  Head:    Normocephalic, without obvious abnormality, atraumatic  Eyes:    PERRL, conjunctiva/corneas clear, EOM's intact, fundi    benign, both eyes  Ears:    Normal TM's and external ear canals, both ears  Nose:   Nares normal, septum  midline, mucosa normal, no drainage    or sinus tenderness  Throat:   Lips, mucosa, and tongue normal; teeth and gums normal  Neck:   Supple, symmetrical, trachea midline, no adenopathy;    thyroid:  no enlargement/tenderness/nodules; no carotid   bruit or JVD  Back:     Symmetric, no curvature, ROM normal, no CVA tenderness  Lungs:     Clear to auscultation bilaterally, respirations unlabored  Chest Wall:    No tenderness or deformity   Heart:    Regular rate and rhythm, S1 and S2 normal, no murmur, rub or gallop  Breast Exam:    Deferred  Abdomen:  Soft, non-tender, bowel sounds active all four quadrants,    no masses, no organomegaly  Genitalia:    Deferred  Extremities:   Extremities normal, atraumatic, no cyanosis or edema  Pulses:   2+ and symmetric all extremities  Skin:   Skin color, texture, turgor normal, no rashes or lesions  Lymph nodes:   Cervical, supraclavicular, and axillary nodes normal  Neurologic:   CNII-XII intact, normal strength, sensation and reflexes    throughout    Assessment/Plan:   Assessment and Plan    Comprehensive Physical Exam (CPE) preventive care annual visit Today patient counseled on age appropriate routine health concerns for screening and prevention, each reviewed and up to date or declined. Immunizations reviewed and up to date or declined. Labs ordered and reviewed. Risk factors for depression reviewed and negative. Hearing function and visual acuity are intact. ADLs screened and addressed as needed. Functional ability and level of safety reviewed and appropriate. Education, counseling and referrals performed based on assessed risks today. Patient provided with a copy of personalized plan for preventive services.  Depression, recurrent; generalized anxiety disorder  Improvement noted. Stressful job and external factors contribute to symptoms. On medication and scheduled for therapy. Uses Ativan  for acute anxiety. - Continue current medication  regimen. - Attend therapy appointment. - Refilled Ativan  prescription.  Insomnia Difficulty sleeping, waking at 3 AM. Current trazodone  dose inadequate. - Increased trazodone  to 100 mg.  Hypothyroidism High thyroid levels in past six months causing fatigue and skin changes. Awaiting endocrinologist evaluation. - Continue to follow up with endocrinologist.  Overweight Exercise hindered by job stress and potential ADHD. - Continue GLP-1 agonist therapy. - Encouraged regular exercise.  Asthma and allergic rhinitis Well-controlled with Singulair . - Continue Singulair . -refill albuterol   Leg swelling Occasional edema during travel or standing. Furosemide  effective as needed. - Continue furosemide  as needed.  Herpes labialis Fever blisters present, no recent medication needed. Acyclovir  available for outbreaks. - Keep acyclovir  on hand.  General Health Maintenance Family history of heart disease discussed. Excellent cholesterol levels. Calcium score discussed for cardiac risk. - Ordered calcium score CT scan. - Continue healthy diet and lifestyle.      [name], PA-C [facility]

## 2024-07-26 NOTE — Patient Instructions (Signed)
 It was great to see you!  A coronary CT calcium scan is a computed tomography scan of the heart for the assessment of severity of coronary artery disease. Specifically, it looks for calcium deposits in atherosclerotic plaques in the coronary arteries that can narrow arteries and increase the risk of heart attack. This is $99 with [facility] currently. We will order this.   Take care,  [name] PA-C

## 2024-07-27 ENCOUNTER — Ambulatory Visit (INDEPENDENT_AMBULATORY_CARE_PROVIDER_SITE_OTHER): Admitting: Psychology

## 2024-07-27 ENCOUNTER — Ambulatory Visit: Payer: Self-pay | Admitting: Physician Assistant

## 2024-07-27 ENCOUNTER — Encounter: Payer: Self-pay | Admitting: Psychology

## 2024-07-27 DIAGNOSIS — F330 Major depressive disorder, recurrent, mild: Secondary | ICD-10-CM

## 2024-07-27 DIAGNOSIS — F411 Generalized anxiety disorder: Secondary | ICD-10-CM

## 2024-07-27 NOTE — Progress Notes (Signed)
 Behavioral Health Treatment Plan   Name:Cynthia Casey   MRN: [id number]   Treatment Plan Development Date: 07/27/24   Strengths: Supportive Relationships, Self Advocate, Able to Communicate Effectively, and enjoys reading, enjoys being outdoors and weekend trips  Supports: Significant Other, Friends, and mom   Client Statement of Needs: Pt coping mechanism or strategies for being more productive and doing the things I need to do.  Probably could benefit guidance with creating balance and boundaries with work and Open to identifying and problem areas.  Treatment Level:outpatient counseling  Client Treatment Preferences:biweekly initially and then monthly.  Virtual appointments.  Continue w/ PCP for medication management.  Diagnosis Depressive Disorders  MDD, mild  Symptoms:  Loss of interest or pleasure in almost all activities-indicated by subjective report or observation by others. and Tiredness, fatigue, or low energy, or decreased efficiency with which routine tasks are completed.  Goals:  Alleviate depressive symptoms to return to effective functioning.  Objectives: Target Date For All Objectives: 07/27/25  Identify and replace thoughts and beliefs that support depression., Learn and implement strategies to overcome depression., Learn and implement problem-solving., Learn and implement decision-making skills., and Use mindfulness and acceptance strategies to reduce experiential and cognitive avoidance and increase value-based behavior.  Progress Documentation:  Progressing  Diagnosis: Anxiety  GAD  Symptoms:  Restlessness or feeling keyed up or on edge, Difficulty concentrating or mind going blank, Sleep disturbance (Difficulty falling or staying asleep, restlessness, or unsatisfying sleep, and feeling overwhelmed  Goals:  Build and employ tools and skills to reduce symptoms of anxiety, worry, and improve functioning day-to-day.  Objectives: Target Date For  All Objectives: 07/27/25  Develop coping tools to manage anxiety including mindfulness, acceptance, relaxation, reframing, and challenging negative thoughts and feelings. and Develop consistent self-care routine including exercise, healthful eating, consistent sleep.  Progress Documentation:  Progressing  Interventions:  CBT - reframing, challenging, cognitive restructuring, ACT, Behavioral Activation, Relaxation and mindfulness, Problem Solving, Solution Focused, and Strength-based   Expected duration of treatment: 1 year and then reevaluate  Party responsible for implementation of interventions: Pt and counselor   This plan has been reviewed and created by the following participants: Pt and counselor   This plan will be reviewed at least every 12 months.   Yes, please see patient chart for sign off.   Brindy Higginbotham, [facility]                [place], LCMHC

## 2024-07-27 NOTE — Progress Notes (Signed)
 [facility] Counselor Initial Adult Exam  Name: Cynthia Casey Date: 07/27/2024 MRN: [id number] DOB: 1976/02/05 PCP: Job Lukes, PA  Time spent: 10:00am-10:58am  Pt is seen for a virtual video visit via caregility.  Pt joins from her home, reporting privacy, and counselor from her home office.  Pt consents to virtual visit and is aware of limitations of such visits.    Guardian/Payee:  self     Paperwork requested: No   Reason for Visit /Presenting Problem:  Pt is referred by her PCP for evaluation of ADHD and counseling for stressors.  Pt reports she is on the waitlist for ADHD evaluation.  Pt after feeling that she has been struggling for past several years w/ executive functioning and doing a lot of reading/research she feels that she may have Undx ADHD.  In addition acknowledges that she has a very stressful job, a lot of change in past 4 years- job change, divorce and move.  Pt reports that she just has no energy or motivation to complete anything and feels that struggles to just keep maintained at work.  Pt reports hx of anxiety and depression and has been on and off antidepressants since college.  Pt reports did d/c her meds 6-7 months ago and recently restarted 6 weeks ago after recognizing that were benefiting.  Pt reports negative self talk about what she should be doing or what she should be able to accomplish.     Mental Status Exam: Appearance:   Well Groomed     Behavior:  Appropriate  Motor:  Normal  Speech/Language:   Clear and Coherent and Normal Rate  Affect:  Appropriate  Mood:  anxious and depressed  Thought process:  normal  Thought content:    WNL  Sensory/Perceptual disturbances:    WNL  Orientation:  oriented to person, place, time/date, and situation  Attention:  Good  Concentration:  Good  Memory:  WNL  Fund of knowledge:   Good  Insight:    Good  Judgment:   Good  Impulse Control:  Good   Reported Symptoms:  Pt reports I always  lived w/ low level of anxiety.  Pt recognizing that increased stressed and increased nighttime awakenings and ruminating on what have to do or didn't get done.  Pt reports as introvert is depleted by all day meetings on zoom/interaction.  Pt reports she can't bring herself to do anything in the evenings.  Pt reports always a procrastinator and will get things done under pressure but not good for her wellbeing.  Pt struggles w/ daily fatigue and feeling loss of interest.  Pt reports struggles w/ focus and concentration.   Pt reports those closest to her see her struggling w/ executive functioning and encouraged to seek testing.   Risk Assessment: Danger to Self:  No Self-injurious Behavior: No Danger to Others: No Duty to Warn:no Physical Aggression / Violence:No  Access to Firearms a concern: No  Gang Involvement:No  Patient / guardian was educated about steps to take if suicide or homicide risk level increases between visits: no While future psychiatric events cannot be accurately predicted, the patient does not currently require acute inpatient psychiatric care and does not currently meet [place]  involuntary commitment criteria.  Substance Abuse History: Current substance abuse: 2 drinks a week.      Past Psychiatric History:   Previous psychological history is significant for anxiety and depression Outpatient Providers:no counseling in the past.   History of Psych Hospitalization: No  Psychological  Testing: seeking ADHD testing   Abuse History:  Victim of: No., none    Report needed: No. Victim of Neglect:No. Perpetrator of none  Witness / Exposure to Domestic Violence: No   Protective Services Involvement: No  Witness to [employer] Violence:  No   Family History:  Family History  Problem Relation Age of Onset   Thyroid disease Mother    Hypertension Mother        well-controlled   Heart attack Mother        second; stent x 2   Anxiety disorder Mother    Depression  Mother    Anxiety disorder Sister    Depression Sister    Epilepsy Sister    Alcohol abuse Sister    ADD / ADHD Brother    Thyroid disease Maternal Grandmother    Hypertension Maternal Grandmother    Stroke Maternal Grandmother    ADD / ADHD Son    Breast cancer Maternal Aunt    Colon cancer Maternal Aunt   Her son is dx w/ ADHD and her brother dx ADHD.  Her mom and sister have anxiety and depression and sister dx w/ alcohol abuse and father also struggled with.   Pt grew up in [place], [place] w/ her mom, dad and younger brother by 5 years and younger sister by 9 years.  Parents divorced when she was in her senior year of high school.   Her mother lives in [place] and brother and sister live out of state.   Pt reports she is close w/her mother and she is the one there to attend to her needs.   Pt reports wasn't close to father- following parents divorce he would call and say hurtful things to her and she made decision to not continue contact.  Pt reports that he never tried to reach out and repair relationship.  Her father died Spring 2025.  Pt reports while in hospital, will couldn't be found and brother brought in attorney and new will drawn up and pt not in will.  Pt reports that not close w/ her siblings and this has created further tension.    Pt maternal grandmother died about 10 years ago at age of 48y/o.  Pt reports she was very close to her grandmother and looked up to her.    Stages more drinking than should have.   Health issues.  Blockage surgery and ended up in hospital for a week.       Living situation: the patient lives with her sons in [place].  Pt moved to [facility] in 2021 from [place] .  Pt reports her ex husband was a civil engineer, contracting and that moved them around a lot.  Her oldest is a Holiday Representative in college and lives in basement apartment as attends [employer].  Her youngest is a sophomore in high school and lives one week w/ her and one week w/ his father.     Sexual Orientation: Straight  Relationship Status: Divorced for 4 years in 2021.  Currently in 3 year relationship w/ partner.  They are in an open relationship w/ Alm who is married w/out chidren. Pt reports that she is very close to her partner and enjoys time together.  Pt also enjoys independence of living alone and knowing that not going to get married.     If a parent, number of children / ages: 2 boys age 20y/o and 15y/o.      Support Systems: mom. Her partner. 5 great Friends form  college.  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  Pt reports her job is very demanding and she usually works more than 40 hours.    Military Service: No   Educational History: Education: Visual Merchandiser.   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  Didn't have any hyperactivity.  Pt feels that she has  compensated well in past for struggles but now longer able to w/ increased stress and having to manage things on her own now.    Religion/Sprituality/World View: Not reported  Any cultural differences that may affect / interfere with treatment:  not applicable   Recreation/Hobbies: reading.  Enjoys being Outside/in sun.  Enjoy cooking.  weekend trips with partner.  Used to work out regularly- peleton or Walk outside.    Stressors: Other: work stressors and struggle w/ executive functioning    Strengths: Supportive Relationships, self aware and researches for self  Barriers:  none   Legal History: Pending legal issue / charges: The patient has no significant history of legal issues. History of legal issue / charges: none  Medical History/Surgical History: reviewed Past Medical History:  Diagnosis Date   Allergy    Anxiety    Asthma    Depression    Thyroid disease     Past  Surgical History:  Procedure Laterality Date   APPENDECTOMY     COLPOSCOPY      Medications: Current Outpatient Medications  Medication Sig Dispense Refill   acyclovir  (ZOVIRAX ) 800 MG tablet Take 1 tablet (800 mg total) by mouth 3 (three) times daily as needed. 30 tablet 1   albuterol  (VENTOLIN  HFA) 108 (90 Base) MCG/ACT inhaler Inhale 2 puffs into the lungs every 6 (six) hours as needed for wheezing or shortness of breath. 8 g 0   escitalopram  (LEXAPRO ) 10 MG tablet Take 1 tablet (10 mg total) by mouth daily. 90 tablet 1   furosemide  (LASIX ) 20 MG tablet Take 1 tablet (20 mg total) by mouth daily as needed for fluid. 90 tablet 1   levonorgestrel  (MIRENA ) 20 MCG/24HR IUD 1 each by Intrauterine route once. Inserted 12/29/2020     levothyroxine (SYNTHROID ) 150 MCG tablet Take 150 mcg by mouth daily before breakfast.     liothyronine (CYTOMEL) 5 MCG tablet Take 10 mcg by mouth daily.     LORazepam  (ATIVAN ) 0.5 MG tablet Take 1 tablet (0.5 mg total) by mouth 2 (two) times daily as needed for anxiety. 20 tablet 0   montelukast  (SINGULAIR ) 10 MG tablet TAKE 1 TABLET BY MOUTH EVERY DAY 90 tablet 0   Tirzepatide-Weight Management 15 MG/0.5ML SOLN Inject 7.5 mg into the skin once a week.     traZODone  (DESYREL ) 100 MG tablet Take 1 tablet (100 mg total) by mouth at bedtime. 90 tablet 3   No current facility-administered medications for this visit.    Allergies  Allergen Reactions   Penicillins Other (See Comments)    Pt states this was a childhood allergy.      Diagnoses:  GAD (generalized anxiety disorder)  Major depressive disorder, recurrent episode, mild  Plan of Care: Pt is a 48 y/o female seeking counseling for stress and increased difficulty managing and feeling overwhelmed.  Pt identifies struggles w/ executive functioning and concerns for undx ADHD.  Pt is on waitlist to have  ADHD testing completed.  Pt is open to counseling as recognizing current stressors and struggles impacting  her mood and seeking supports and strategies for coping.  Pt to f/u w/ biweekly counseling initially, complete ADHD testing and shift to monthly counseling when appropriate.  Pt to f/u  as scheduled w/ PCP for medication management.      BARBARANN APPL, LCMHC

## 2024-08-11 ENCOUNTER — Ambulatory Visit: Admitting: Psychology

## 2024-09-08 ENCOUNTER — Ambulatory Visit: Admitting: Psychology

## 2024-09-08 DIAGNOSIS — F89 Unspecified disorder of psychological development: Secondary | ICD-10-CM

## 2024-09-08 NOTE — Progress Notes (Addendum)
 Date: 09/08/2024 Appointment Start Time: 5:01pm Duration: 95 minutes Provider: Frederic Fire, PsyD Type of Session: Initial Appointment for Evaluation  Location of Patient: Home Location of Provider: Provider's Home (private office) Type of Contact: Caregility video visit with audio  Session Content:  Prior to proceeding with today's appointment, two pieces of identifying information were obtained from [name] to verify identity. In addition, [name]'s physical location at the time of this appointment was obtained. In the event of technical difficulties, [name] shared a phone number she could be reached at. [name] and this provider participated in today's telepsychological service. [name] denied anyone else being present in the room or on the virtual appointment.  The provider's role was explained to [name]. The provider reviewed and discussed issues of confidentiality, privacy, and limits therein (e.g., reporting obligations). In addition to verbal informed consent, written informed consent for psychological services was obtained from [name] prior to the initial appointment. Written consent included information concerning the practice, financial arrangements, and confidentiality and patients' rights. Since the clinic is not a 24/7 crisis center, mental health emergency resources were shared, and the provider explained e-mail, voicemail, and/or other messaging systems should be utilized only for non-emergency reasons. This provider also explained that information obtained during appointments will be placed in their electronic medical record in a confidential manner. [name] verbally acknowledged understanding of the aforementioned and agreed to use mental health emergency resources discussed if needed. Moreover, [name] agreed information may be shared with other [facility] or their referring provider(s) as needed for coordination of care. By signing the new patient documents, [name] provided written consent for  coordination of care. [name] verbally acknowledged understanding she is ultimately responsible for understanding her insurance benefits as it relates to reimbursement of telepsychological and in-person services. This provider also reviewed confidentiality, as it relates to telepsychological services, as well as the rationale for telepsychological services. This provider further explained that video should not be captured, photos should not be taken, nor should testing stimuli be copied or recorded as it would be a copyright violation. [name] expressed understanding of the aforementioned, and verbally consented to proceed. [name] is aware of the limitations of teleheath visits and verbally consented to proceed.  [name] completed the psychiatric diagnostic evaluation (history, including past, family, social, and  psychiatric history; behavioral observations; and establishment of a provisional diagnosis). The evaluation was completed in 95 minutes. Code 09208 was billed.   Mental Status Examination:  Appearance:  neat Behavior: appropriate to circumstances Mood: euthymic Affect: mood congruent Speech: pressured Eye Contact: appropriate Psychomotor Activity: restless Thought Process: denies suicidal, homicidal, and self-harm ideation, plan and intent Content/Perceptual Disturbances: none Orientation: AAOx4 Cognition/Sensorium: intact Insight: good Judgment: good  Provisional DSM-5 diagnosis(es):  F89 Unspecified Disorder of Psychological Development   Plan: Testing is expected to answer the question, does the individual meet criteria for ADHD when age, other mental health concerns, and cognitive functioning are taken into consideration? Further testing is warranted because a diagnosis cannot be given solely based on current interview data (further data is required). Testing results are expected to answer the remaining diagnostic questions in order to provide an accurate diagnosis and assist in  treatment planning with an expectation of improved clinical outcome. Mallery is currently scheduled for an appointment on 09/22/2024 at 2pm via Caregility video visit with audio.       CONFIDENTIAL PSYCHOLOGICAL EVALUATION ______________________________________________________________________________ Name: Cynthia Casey Date of Birth: 13-Nov-1975    Age: 48  SOURCE AND REASON FOR REFERRAL: Cynthia Casey was  referred by [name], PA for an evaluation to ascertain if they meet the criteria for Attention Deficit/Hyperactivity Disorder (ADHD).    BACKGROUND INFORMATION AND PRESENTING PROBLEM: Cynthia Casey is a 48 year old female who resides in [place] .  Cynthia Casey reported she is in the most stressful job [she has] ever been in with the most responsibility [she has] ever had and that she feel[s] like [she is] struggling with details and focus, which led [to her] doing a fair amount of research on ADHD in women. She further reported her son and brother have been diagnosed with ADHD, and that her significant other, whom she noted may have ADHD, expressed concerns she has ADHD as well. She endorsed experiencing the following ADHD-related symptoms:  Symptoms Frequency   Often Occasionally Infrequent/No Significant Issues  Inattention Criterion (DSM-5-TR)     Making careless mistakes and missing small details.   She described this symptom as occasionally occurring, noting forgetfulness contributes to it, but that natural cognitive abilities help to mitigate its effects. She further described the busier monte has gotten, has led to less time to check her work, which has exacerbated the symptom.    Being easily distracted by various stimuli and the mind often being elsewhere even when no apparent distractions exist.  She discussed being prone to distraction by various stimuli (e.g., irrelevant tasks and thoughts, messages, notifications, and movements). She also  discussed she has periods of hyperfocus on things she enjoys (e.g., a book).     Trouble sustaining attention during conversations.   She reported this symptom moderately occurs and that its effects are noticeable, noting it can lead to missing what others said.    Does not follow through on instructions and fails to finish tasks.  She endorsed commonly having task initiation and maintenance issues.    Difficulty organizing tasks and activities.  She discussed regularly haphazardly completing tasks and having trouble with prioritization and following through with plans, sharing that forgetfulness and inattention contribute to this symptom.     Avoids, dislikes, or is reluctant to engage in tasks that require sustained mental effort. She stated she often procrastinate[es] and is always behind and there is always work to be done, although noted she work[s] well under pressure and that a sense of urgency is a primary motivator to starting tasks.     Loses things necessary for tasks or activities.  She noted she often misplaces items.    Forgetful in daily activities.  She also noted she is often forgetful in other various daily activities (e.g., tasks, what she wanted to say, plans, appointments, and to return phone calls and texts), despite the importance she places on them, adding it can lead to self-critical thoughts.      Hyperactivity and Impulsivity Criterion (DSM-5-TR)     Fidgets with hands or feet or squirms in seat. She reported habitually, and largely unconsciously, fidgeting (e.g., tapping her nails, shifting in her seat, rubbing her hands, and clicking a pen), noting fidgeting can be soothing and helps with sustaining attention. She further reported that others have commented on this symptom.     Leaves seat in situations in which remaining seated is expected.     She denied experiencing significant issues with this symptom.   Feelings of restlessness. She stated her mind is  always going with racing thoughts about millions of things and that she struggle[s] to turn [her] brain off and that she wake[s] up thinking about things forgotten during the day, which  contributes to sleep impairment and difficulty relaxing.     Difficulty playing or engaging in leisure activities quietly.   She denied experiencing significant issues with this symptom.  On the go or often acts as if driven by a motor. She shared that she is commonly thinking about things she should be doing, which contributes to difficulty relaxing. She also shared that others have commented she can be difficult to keep up with while she is speaking.     Talks excessively.  She described herself as prone to providing excessive details and being all over the place, noting others have commented on it.     Interrupts others.  She also described herself as prone to interrupting, and that she hate[s] it, attributing the interrupting to [a thought] will be out of [her] mouth before [she] can stop it, her head [being] like five steps ahead, and/or concerns she will forget what she wants to say. She indicated she tends to catch it quickly.     Impatience.  She stated she despise[s] waiting and that the longer [the wait] goes, the angrier [she] get[s].    ADHD-Related Symptoms that Assist in Identifying ADHD but are not in the DSM-5-TR Jeanna, 2021, p. 6-12 and [phone])     Emotional dysregulation and overstimulation. She discussed being quick to anger, becoming upset if others break her attention while she is focused on a task, becoming stressed if multiple people are trying to talk to her at one time, and experiencing anxiety in settings with many potential distractors.     Making decisions impulsively. She also discussed being prone to purchasing and saying things she later regrets, which contributes to trouble saving and debt, as well as sometimes agoniz[ing] about what she said.     Tending  to drive much faster than others. She stated if she is not utilizing cruise control, she can find [herself] going 95 miles per hour which led to speeding tickets in the past, noting that inattention contributes to this concern.     Trouble following through on promises or commitments made to others.  She reported she fairly regularly has trouble following through on promises or commitments, especially if the commitment is perceived as little stuff.    Trouble doing things in proper order or sequence.  She reported while she generally will follow proper order and sequence of tasks, forgetfulness leads to her hav[ing] to go back [to re-read the instructions] a lot of times to ensure she does so.    Ms. Christiansen discussed a history of depressive episodes that occurred seemingly randomly and included hypersomnia, emotional lability, and reduced drive, noting the last episode was approximately ten years ago; generalized anxiety that she utilizes psychotropic medication to assist with; sleep onset and maintenance issues that she attributed to thinking about things [she] needed to do or should have done that she utilizes Trazadone to assist with, as well as regularly not feeling rested upon waking and tossing and turning in her sleep, adding that she previously underwent a sleep study and that sleep apnea was not identified; overeating-related behaviors (e.g., eating an amount of junk food that was significantly more than planned and until uncomfortably full) that she noted has largely ceased at least in part to use of a GLP-1 medication; emotional eating-related behaviors (e.g., eating when stressed but not physically hungry); and periods of passive suicidal ideation (e.g., It would be easier [if I was dead]) without plan or intent. Ms. Mcfall expressed a belief her ADHD-related  symptoms have occurred since childhood and are consistent across situations and independent of mood. She also expressed  that they have seemingly become exacerbated recently as monte has] become a busier person. She stated her coping and compensatory strategies include utilizing lists; others' support; using [employer]; designating spots for items; developing routines; writing notes; limiting her access to credit cards; and double checking her work.  Ms. Kiser denied awareness of having ever experienced any developmental milestone delays, grade retention, learning disability diagnosis, or having an individualized education plan. She described that she was academically gifted and that learning came naturally to [her]; However, she shared that she struggled in classes if [she] had to just listen, adding she need[ed] to [be able] to look at words to focus and understand the material, and that she was prone to having trouble sustaining her attention in classes without visual learning material. She also noted that she was prone to starting and completing homework shortly before its deadline. She discussed experiencing difficulties with procrastination and studying, as well as having a tendency to skip class because there was less pressure from others to attend, which contributed to her almost having failed out [of college] during the first semester. Ms. Nadeau reported she obtained a master's degree in public administration. She stated examples of employment difficulties that include forgetfulness and becoming distracted by other tasks, sharing her responsibilities have increased recently which led to her various coping and compensatory strategies being less effective. She stated she tends to receive strong performance reviews, although noted being always exhausted and ha[s] nothing left by the end of the work day as she is giving everything [she has] to give to function at work, and that she spend[s] most of [her] weekends recovering from the week.   Ms. Eppard reported her medical history is significant  for a thyroid condition that is more or less managed and fainting. She denied ever utilizing psychotherapeutic services or having been psychiatrically hospitalized. She reported she has reigned in [her] substance intake fairly significantly, noting there were periods of [her] life where [she was] drinking far more than [she] intended to or wanted to (e.g., a whole bottle of wine all to [herself]), but that awareness of a family history of problematic substance use led to her being more conscious of her alcohol use, describing her current alcohol as not problematic. She noted current use of an unspecified amount of delta products to assist in calm[ing] [her] brain and use of 1-2 cups of coffee and 32-64oz of unsweetened ice tea a day. She denied use of all other recreational and illicit substance. She also denied ever experiencing the full criteria for hypomanic or manic episode; autism spectrum disorder; obsessive compulsive disorder; psychosis; trauma- and stressor-related disorder; homicidal ideation, plan, or intent; and legal involvement. Ms. Spelman stated her familial mental health history is significant for anxiety and depression (mother and grandmother), substance abuse problems and likely mental health problems associated with it (brother), and ADHD (brother and son).   Chart Review: Per an 05/21/2024 appointment note, [name], PA noted, Ms. Louvier present[ed] with a request to restart antidepressant medication, as Ms. Aldrete had discontinued her use of Lexapro  and Wellbutrin . Ms. Worley further noted that Ms. Jocelyn currently utilizes Trazodone  to assist with sleep maintenance issues. Ms. Buttner reported Ms. Driggs also had concerns that she has ADHD, as she was identifying with symptoms common in women her age, and has a family history of ADHD in her brother and son. A referral for an ADHD  evaluation was placed.   Per an 07/26/2024 appointment note, Ms. Worley  reported, [Ms. Sweis] is not currently exercising due to job stress and struggles with balancing life responsibilities. She acknowledges potential impacts on executive functioning from ADHD or perimenopause.   Per an 07/27/2024 appointment note, Damien Herald, [facility], noted, [Ms. Wedemeyer] is referred by her PCP for evaluation of ADHD and counseling for stressors. [Ms. Knisley] reports she is on the waitlist for ADHD evaluation. [Ms. Wieting] after feeling that she has been struggling for past several years w/ executive functioning and doing a lot of reading/research she feels that she may have Undx ADHD. Ms. Herald also noted, in addition [Ms. Bernard] acknowledges that she has a very stressful job, a lot of change in past 4 years- job change, divorce and move and that she just has no energy or motivation to complete anything and feels that struggles to just keep maintained at work.              Frederic ONEIDA Fire, PsyD

## 2024-09-22 ENCOUNTER — Ambulatory Visit: Admitting: Psychology

## 2024-09-22 DIAGNOSIS — F89 Unspecified disorder of psychological development: Secondary | ICD-10-CM

## 2024-09-22 NOTE — Progress Notes (Signed)
 Date: 09/22/2024   Appointment Start Time: 2:01pm Duration: 40 minutes Provider: Frederic Fire, PsyD Type of Session: Testing Appointment for Evaluation  Location of Patient: Home Location of Provider: Provider's Home (private office) Type of Contact: Caregility video visit with audio  Session Content: Today's appointment was a telepsychological visit. Geniyah is aware it is her responsibility to secure confidentiality on her end of the session. Prior to proceeding with today's appointment, Nance's physical location at the time of this appointment was obtained as well a phone number she could be reached at in the event of technical difficulties. Harmani denied anyone else being present in the room or on the virtual appointment. This provider reviewed that video should not be captured, photos should not be taken, nor should testing stimuli be copied or recorded as it would be a copyright violation. Imya expressed understanding of the aforementioned, and verbally consented to proceed. The WAIS-5 was administered, scored, and interpreted by this evaluator. Denitra is aware of the limitations of teleheath visits and verbally consented to proceed.  Billing codes will be input on the feedback appointment. There are no billing codes for the testing appointment.   Provisional DSM-5 diagnosis(es):  F89 Unspecified Disorder of Psychological Development   Plan: Berneta was scheduled for a feedback appointment on 10/08/2023 at 9am via Caregility video visit with audio.                Frederic ONEIDA Fire, PsyD

## 2024-10-05 ENCOUNTER — Ambulatory Visit: Payer: Self-pay

## 2024-10-05 NOTE — Telephone Encounter (Signed)
 Appt 10/07/2024

## 2024-10-05 NOTE — Telephone Encounter (Signed)
 FYI Only or Action Required?: FYI only for provider: appointment scheduled on 10/07/24.  Patient was last seen in primary care on 07/26/2024 by Cynthia Lukes, PA.  Called Nurse Triage reporting Cynthia Casey.  Symptoms began yesterday.  Interventions attempted: Ice/heat application.  Symptoms are: unchanged.  Triage Disposition: See PCP When Office is Open (Within 3 Days)  Patient/caregiver understands and will follow disposition?: Yes    Copied from CRM #[id number]. Topic: Clinical - Red Word Triage >> Oct 05, 2024  8:47 AM Cynthia Casey: Red Word that prompted transfer to Nurse Triage: Pt called in stating that she has a swollen eye. Pt stated that its red and itchy. Pt mentioned that she does have pain but it isnt severe. Pt denied any other symptoms. Warm transferred to nurse triage.   Reason for Disposition  [1] MILD eyelid swelling (puffiness) AND [2] persists > 3 days  (Exception: Suspect mosquito bites.)  Answer Assessment - Initial Assessment Questions Pt called requesting appt for L eyelid swelling and itching since yesterday. Pt has tried warm and cold compress without relief. Pt has d/c cosmetics at this time as well. Pt denies any fever, no vision changes, no rash, no redness to eye. Pt states she has family hx of thyroid eye disease and does have thyroid issues herself. Offered appt tomorrow but pt not available, discussed UC and pt would like to try antihistamines for 24 hours then see provider. Appointment scheduled for evaluation. Patient agrees with plan of care, and will call back if anything changes, or if symptoms worsen.      1. ONSET: When did the swelling start? (e.g., minutes, hours, days)     Yesterday  2. LOCATION: What part of the eyelid is swollen?     L upper eyelid   3. SEVERITY: How swollen is it? (e.g., describe; mild, moderate, or severe)     Mild-moderate today; pt states seems more puffy than yesterday   4. ITCHING: Is there any  itching? If Yes, ask: How much?   (Scale 1-10; mild, moderate or severe)     Yes; mild   5. PAIN: Is the swelling painful to touch? If Yes, ask: How painful is it?   (Scale 1-10; mild, moderate or severe)     Yes; mild   6. FEVER: Do you have a fever? If Yes, ask: What is it, how was it measured, and when did it start?      No   7. CAUSE: What do you think is causing the swelling?     Unsure   8. RECURRENT SYMPTOM: Have you had eyelid swelling before? If Yes, ask: When was the last time? What happened that time?     No; pt states she does have thyroid issues and family hx of thyroid eye disease   9. OTHER SYMPTOMS: Do you have any other symptoms? (e.g., blurred vision, eye discharge, rash, runny nose)     None  Protocols used: Eyelid Swelling-A-AH

## 2024-10-07 ENCOUNTER — Ambulatory Visit: Admitting: Physician Assistant

## 2024-10-07 ENCOUNTER — Ambulatory Visit: Admitting: Psychology

## 2024-10-07 DIAGNOSIS — F902 Attention-deficit hyperactivity disorder, combined type: Secondary | ICD-10-CM

## 2024-10-07 NOTE — Progress Notes (Signed)
 Testing and Report Writing Information: The following measures  were administered, scored, and interpreted by this provider:  Generalized Anxiety Disorder-7 (GAD-7; 5 minutes), Patient Health Questionnaire-9 (PHQ-9; 5 minutes), Wechsler Adult Intelligence Scale-Fifth Edition (WAIS-5; 70 minutes), CNS Vital Signs (45 minutes), Adult Attention Deficit/Hyperactivity Disorder Self-Report Scale Checklist (ASRSv1.1; 15 minutes), Behavior Rating Inventory for Executive Function - 2A - Self Report (BRIEF 2A; 20 minutes) and Behavior Rating Inventory for Executive Function - 2A - Informant (BRIEF-2A; 20 minutes), and Personality Assessment Inventory (PAI; 50 minutes). A total of 230 minutes was spent on the administration and scoring of the aforementioned measures. Codes 03863 and [phone] (7 units) were billed.  Please see the assessment for additional details. This provider completed the written report which includes integration of patient data, interpretation of standardized test results, interpretation of clinical data, review of information provided by Lorene and any collateral information/documentation, and clinical decision making (345 minutes in total).  Feedback Appointment: Date: 10/07/2024 Appointment Start Time: 9:02am Duration: 47 minutes Provider: Frederic Fire, PsyD Type of Session: Feedback Appointment for Evaluation  Location of Patient: Home Location of Provider: Provider's Home (private office) Type of Contact: Caregility video visit with audio  Session Content: Today's appointment was a telepsychological visit due to COVID-19. Kristine is aware it is her responsibility to secure confidentiality on her end of the session. She provided verbal consent to proceed with today's appointment. Prior to proceeding with today's appointment, Fate's physical location at the time of this appointment was obtained as well a phone number she could be reached at in the event of technical difficulties. [place] denied  anyone else being present in the room or on the virtual appointment. Desiraye is aware of the limitations of teleheath visits and verbally consented to proceed.  This provider and Jackqueline completed the interactive feedback session which includes reviewing the aforementioned measures, treatment recommendations, and diagnostic conclusions.   The interactive feedback session was completed today and a total of 47 minutes was spent on feedback. Code 03867 was billed for feedback session.   DSM-5 Diagnosis(es):  F90.2 Attention-Deficit/Hyperactivity Disorder, Combined Presentation, Moderate  Time Requirements: Assessment scoring and interpreting: 230 minutes (billing code 03863 and [phone] [7 units]) Feedback: 47 minutes (billing code 03867) Report writing: 345 total minutes. 09/19/2024: 4:10-4:35pm. 09/23/2024: 4:45-5:50pm, 6:35-6:45pm, 8:35-9:15pm, and 9:30-9:40pm 09/25/2023: 12:55-1pm and 4:05-4:25pm. 09/26/2023: 12:30-12:40pm. 09/28/2023: 9:10-10:20am. 10/02/2024: 2-3pm. 10/06/2024: 8:40-9pm. 10/07/2024: 5:30-5:40pm. (billing code 03866 [6 units])  Plan: Lorene provided verbal consent for her evaluation to be sent via e-mail. No further follow-up planned by this provider.        CONFIDENTIAL PSYCHOLOGICAL EVALUATION ______________________________________________________________________________ Name: Ms. Danyah Guastella Date of Birth: 01/06/1976    Age: 49 Dates of Evaluation: 09/08/2024, 09/10/2024, 09/14/2024, and 09/22/2024  SOURCE AND REASON FOR REFERRAL: Ms. Sunshyne Horvath was referred by [name], PA for an evaluation to ascertain if she meets criteria for Attention Deficit/Hyperactivity Disorder (ADHD).   EVALUATIVE PROCEDURES: Clinical Interview with Ms. Kamron Portee (09/08/2024) Wechsler Adult Intelligence Scale-Fourth Edition (WAIS-5; 09/22/2024) CNS Vital Signs (09/14/2024) Adult Attention Deficit/Hyperactivity Disorder Self-Report Scale Checklist (09/14/2024) Behavior Rating Inventory for  Executive Function - A - Self Report Behavior Rating Inventory for Executive Function - 2A - Self Report (BRIEF-2A; 09/09/2024) and Informant (09/10/2024) Personality Assessment Inventory (PAI; 09/22/2024) Patient Health Questionnaire-9 (PHQ-9) Generalized Anxiety Disorder-7 (GAD-7)   BACKGROUND INFORMATION AND PRESENTING PROBLEM: Ms. Maniya Donovan is a 49 year old female who resides in  .  Ms. Benassi reported she is in the most stressful job [she has] ever  been in with the most responsibility [she has] ever had and that she feel[s] like [she is] struggling with details and focus, which led [to her] doing a fair amount of research on ADHD in women. She further reported that her son and brother have been diagnosed with ADHD, and that her significant other, whom she noted may have ADHD, expressed concerns that she has ADHD as well. She endorsed experiencing the following ADHD-related symptoms:  Symptoms Frequency   Often Occasionally Infrequent/No Significant Issues  Inattention Criterion (DSM-5-TR)     Making careless mistakes and missing small details.   She described this symptom as occasionally occurring, noting forgetfulness contributes to it, but that natural cognitive abilities help to mitigate its effects. She further described the busier monte has gotten, has led to less time to check her work, which has exacerbated the symptom.    Being easily distracted by various stimuli and the mind often being elsewhere even when no apparent distractions exist.  She discussed being prone to distraction by various stimuli (e.g., irrelevant tasks and thoughts, messages, notifications, and movements). She also discussed she has periods of hyperfocus on things she enjoys (e.g., a book).     Trouble sustaining attention during conversations.   She reported this symptom moderately occurs and that its effects are noticeable, noting it can lead to missing what others said.    Does  not follow through on instructions and fails to finish tasks.  She endorsed commonly having task initiation and maintenance issues.    Difficulty organizing tasks and activities.  She discussed regularly haphazardly completing tasks and having trouble with prioritization and following through with plans, sharing that forgetfulness and inattention contribute to this symptom.     Avoids, dislikes, or is reluctant to engage in tasks that require sustained mental effort. She stated she often procrastinate[es] and is always behind and there is always work to be done, although noted she work[s] well under pressure and that a sense of urgency is a primary motivator to starting tasks.     Loses things necessary for tasks or activities.  She noted she often misplaces items.    Forgetful in daily activities.  She also noted she is often forgetful in other various daily activities (e.g., tasks, what she wanted to say, plans, appointments, and to return phone calls and texts), despite the importance she places on them, adding it can lead to self-critical thoughts.      Hyperactivity and Impulsivity Criterion (DSM-5-TR)     Fidgets with hands or feet or squirms in seat. She reported habitually, and largely unconsciously, fidgeting (e.g., tapping her nails, shifting in her seat, rubbing her hands, and clicking a pen), noting fidgeting can be soothing and helps with sustaining attention. She further reported that others have commented on this symptom.     Leaves seat in situations in which remaining seated is expected.     She denied experiencing significant issues with this symptom.   Feelings of restlessness. She stated her mind is always going with racing thoughts about millions of things and that she struggle[s] to turn [her] brain off and that she wake[s] up thinking about things forgotten during the day, which contributes to sleep impairment and difficulty relaxing.     Difficulty playing or  engaging in leisure activities quietly.   She denied experiencing significant issues with this symptom.  On the go or often acts as if driven by a motor. She shared that she is commonly thinking about things she  should be doing, which contributes to difficulty relaxing. She also shared that others have commented she can be difficult to keep up with while she is speaking.     Talks excessively.  She described herself as prone to providing excessive details and being all over the place, noting others have commented on it.     Interrupts others.  She also described herself as prone to interrupting, and that she hate[s] it, attributing the interrupting to [a thought] will be out of [her] mouth before [she] can stop it, her head [being] like five steps ahead, and/or concerns she will forget what she wants to say. She indicated she tends to catch it quickly.     Impatience.  She stated she despise[s] waiting and that the longer [the wait] goes, the angrier [she] get[s].    ADHD-Related Symptoms that Assist in Identifying ADHD but are not in the DSM-5-TR Jeanna, 2021, p. 6-12 and [phone])     Emotional dysregulation and overstimulation. She discussed being quick to anger, becoming upset if others break her attention while she is focused on a task, becoming stressed if multiple people are trying to talk to her at one time, and experiencing anxiety in settings with many potential distractors.     Making decisions impulsively. She also discussed being prone to purchasing and saying things she later regrets, which contributes to trouble saving and debt, as well as sometimes agoniz[ing] about what she said.     Tending to drive much faster than others. She stated if she is not utilizing cruise control, she can find [herself] going 95 miles per hour which led to speeding tickets in the past, noting that inattention contributes to this concern.     Trouble following through on promises or  commitments made to others.  She reported she fairly regularly has trouble following through on promises or commitments, especially if the commitment is perceived as little stuff.    Trouble doing things in proper order or sequence.  She reported while she generally will follow proper order and sequence of tasks, forgetfulness leads to her hav[ing] to go back [to re-read the instructions] a lot of times to ensure she does so.    Ms. Lattner discussed a history of depressive episodes that occurred seemingly randomly and included hypersomnia, emotional lability, and reduced drive, noting the last episode was approximately ten years ago; generalized anxiety that she utilizes psychotropic medication to assist with; sleep onset and maintenance issues that she attributed to thinking about things [she] needed to do or should have done that she utilizes Trazadone to assist with, as well as regularly not feeling rested upon waking and tossing and turning in her sleep, adding that she previously underwent a sleep study and that sleep apnea was not identified; overeating-related behaviors (e.g., eating an amount of junk food that was significantly more than planned and until uncomfortably full) that she noted has largely ceased at least in part to use of a GLP-1 medication; emotional eating-related behaviors (e.g., eating when stressed but not physically hungry); and periods of passive suicidal ideation (e.g., It would be easier [if I was dead]) without plan or intent. Ms. Tortora expressed a belief her ADHD-related symptoms have occurred since childhood and are consistent across situations and independent of mood. She also expressed that they have seemingly become exacerbated recently as monte has] become a busier person. She stated her coping and compensatory strategies include utilizing lists; others' support; using [employer]; designating spots for items; developing routines; writing  notes; limiting her  access to credit cards; and double checking her work.  Ms. Pucillo denied awareness of having ever experienced any developmental milestone delays, grade retention, learning disability diagnosis, or having an individualized education plan. She described that she was academically gifted and that learning came naturally to [her]; However, she shared that she struggled in classes if [she] had to just listen, adding she need[ed] to [be able] to look at words to focus and understand the material, and that she was prone to having trouble sustaining her attention in classes without visual learning material. She also noted that she was prone to starting and completing homework shortly before the deadline. She discussed experiencing difficulties with procrastination and studying, as well as tending to skip class because there was less pressure from others to attend, which contributed to her almost having failed out [of college] during the first semester. Ms. Sian reported she obtained a master's degree in public administration. She stated examples of employment difficulties that include forgetfulness and becoming distracted by other tasks, sharing her responsibilities have increased recently which led to her various coping and compensatory strategies being less effective. She stated she tends to receive strong performance reviews, although noted being always exhausted and ha[s] nothing left by the end of the work day as she is giving everything [she has] to give to function at work, and that she spend[s] most of [her] weekends recovering from the week.   Ms. Belmares reported her medical history is significant for a thyroid condition that is more or less managed and fainting. She reported she has reigned in [her] substance intake fairly significantly, noting there were periods of [her] life where [she was] drinking far more than [she] intended to or wanted to (e.g., a whole bottle of  wine all to [herself]), but that awareness of a family history of problematic substance use led to her being more conscious of her alcohol use, describing her current alcohol as not problematic. She noted current use of an unspecified amount of delta products to assist in calm[ing] [her] brain and use of 1-2 cups of coffee and 32-64oz of unsweetened ice tea a day. She denied use of all other recreational and illicit substance. She also denied ever experiencing psychiatric hospitalization or meeting the full criteria for hypomanic or manic episode; autism spectrum disorder; obsessive compulsive disorder; psychosis; trauma- and stressor-related disorder; homicidal ideation, plan, or intent; and legal involvement. Ms. Primiano stated her familial mental health history is significant for anxiety and depression (mother and grandmother), substance abuse problems and likely mental health problems associated with it (brother), and ADHD (brother and son).   Chart Review: Per an 05/21/2024 appointment note, [name], PA noted, Ms. Delbridge present[ed] with a request to restart antidepressant medication, as Ms. Chizmar had discontinued her use of Lexapro  and Wellbutrin . Ms. Worley further noted that Ms. Woodrum currently utilizes Trazodone  to assist with sleep maintenance issues. Ms. Buttner reported Ms. Cullers also had concerns that she has ADHD, as she was identifying with symptoms common in women her age, and has a family history of ADHD in her brother and son. A referral for an ADHD evaluation was placed.   Per an 07/26/2024 appointment note, Ms. Worley reported, [Ms. Lovick] is not currently exercising due to job stress and struggles with balancing life responsibilities. She acknowledges potential impacts on executive functioning from ADHD or perimenopause.   Per an 07/27/2024 appointment note, Damien Herald, [facility], noted, [Ms. Revelle] is referred by her PCP for evaluation of  ADHD and counseling  for stressors. [Ms. Dech] reports she is on the waitlist for ADHD evaluation. [Ms. Selsor] after feeling that she has been struggling for past several years w/ executive functioning and doing a lot of reading/research she feels that she may have Undx ADHD. Ms. Barbarann also noted, in addition [Ms. Phebus] acknowledges that she has a very stressful job, a lot of change in past 4 years- job change, divorce and move and that she just has no energy or motivation to complete anything and feels that struggles to just keep maintained at work.   BEHAVIORAL OBSERVATIONS: Ms. Faidley presented on time for the evaluation. She was well-groomed. She was oriented to the time, place, person, and purpose of the appointment. During the evaluation, Ms. Sieler verbalized and/or demonstrated impulsivity (e.g., making abrupt facial reactions to subtest instructions) and working memory-related problems (e.g., indicating she had abruptly loss the verbally provided digits she was trying to retain, looking down in what appeared to be an effort to limit potential distractors, and occasionally grimacing while attempting to listen to, and retain, verbally provided information). Throughout the evaluation, she maintained appropriate eye contact. Her thought processes and content were logical, coherent, and goal-directed. There were no overt signs of a thought disorder or perceptual disturbances, nor did she report such symptomatology. There was no evidence of paraphasias (i.e., errors in speech, gross mispronunciations, and word substitutions), repetition deficits, or disturbances in volume or prosody (i.e., rhythm and intonation). Overall, based on Ms. Asaro's approach to testing, the current results are believed to be a fair estimate of her abilities.  PROCEDURAL CONSIDERATIONS: Psychological testing measures were conducted through a virtual visit with video and audio capabilities, but otherwise in a standard manner.   The Wechsler  Adult Intelligence Scale, Fifth Edition (WAIS-5) was administered via remote telepractice using digital stimulus materials on [employer]'s Q-global system. The remote testing environment appeared free of distractions, adequate rapport was established with the examinee via video/audio capabilities, and Ms. Tyner appeared appropriately engaged in the task throughout the session. No significant technological problems or distractions were noted during administration. Modifications to the standardization procedure included: none. The WAIS-5 subtests, or similar tasks, have received initial validation in several samples for remote telepractice and digital format administration, and the results are considered a valid description of Ms. Pocius's skills and abilities.  CLINICAL FINDINGS:  COGNITIVE FUNCTIONING  Wechsler Adult Intelligence Scale, Fourth Edition (WAIS-5): Ms. Thedford completed subtests of the WAIS-5, a full-scale measure of cognitive ability. The WAIS-5 comprises four indices that measure cognitive processes that comprise intellectual ability; however, only subtests from the Verbal Comprehension and Working Memory indices were administered. As a result, Full-Scale-IQ (FSIQ) and General Ability Index (GAI) could not be determined.   WAIS-5 Scale/Subtest Composite Score/Scaled Score 95% Confidence Interval Percentile Rank Qualitative Description  Verbal Comprehension (VCI) 127 118-132 96 Very High  Similarities 16     Vocabulary 14     Working Memory (WMI) 100 93-107 50 Average  Digit Sequencing  12     Running Digits 8     Digits Forward 12       The Verbal Comprehension Index (VCI) measures one's ability to receive, comprehend, and express language. It also measures the ability to retrieve previously learned information and to understand relationships between words and concepts presented orally. Ms. Rosiles obtained a VCI score of 127 (96th percentile), placing her in the very high range compared  to same-aged peers. Her performance on the subtests comprising this index was  similar, which suggests her verbal reasoning abilities are comparably developed.   The Working Memory Index (WMI) measures one's ability to sustain attention, concentrate, and exert mental control. Ms. Nicotra obtained a WMI score of 100 (50th percentile), placing her in the average range compared to same-aged peers. The 27-point difference between her VCI and WMI composite scores is statistically significant at the .05 level, which suggests her ability to sustain attention, concentrate, and exert mental control is a weakness relative to her verbal reasoning abilities. Moreover, her performance on the subtests was diverse. Her best performances were on the Digit Sequencing and Digits Forward subtests. Her lowest performance was on the Running Digits subtest. This performance pattern suggests she best utilizes working memory when performing active mental manipulation of the information she has registered and maintained, rather than when actively maintaining, refreshing, and updating it. It is also possible that a slower pace benefited her auditory registration of information.   ATTENTION AND PROCESSING  CNS Vital Signs: The CNS Vital Signs assessment evaluates the neurocognitive status of an individual and covers a range of mental processes. The results of the CNS Vital Signs testing indicated low neurocognitive processing ability. Attentional abilities ranged from low to average, with complex attention in the low range, and simple attention and sustained attention in the average range. Executive function and cognitive flexibility were very low. Psychomotor speed, motor speed, and reaction time were low average to very low. Working memory was average. Visual memory (images) and verbal memory (words) were average, which indicates they are comparably developed. The results suggest that Ms. Marlin experiences impairments in reaction time,  cognitive flexibility, and executive function, as well as weakness in psychomotor speed and motor speed. This performance pattern suggests she has difficulty tracking and responding to a variety of stimuli over extended periods and when performing mental tasks requiring vigilance.   Domain  Standard Score Percentile Validity Indicator Guideline  Neurocognitive Index 76 5 Yes Low  Composite Memory 100 50 Yes Average  Verbal Memory 105 63 Yes Average  Visual Memory 95 37 Yes Average  Psychomotor Speed 85 16 Yes Low Average  Reaction Time 56 1 Yes Very Low  Complex Attention 77 6 Yes Low  Cognitive Flexibility 62 1 Yes Very Low  Processing Speed  100 50 Yes Average  Executive Function 60 1 Yes Very Low  Working Memory 99 47 Yes Average  Sustained Attention 97 42 Yes Average  Simple Attention 94 34 Yes Average  Motor Speed 81 10 Yes Low Average   EXECUTIVE FUNCTION  Behavior Rating Inventory of Executive Function, Second Edition EVENT ORGANISER) Self-Report: Ms. Kitchens completed the Self-Report Form of the Behavior Rating Inventory of Executive Function-Adult Version, Second Edition KIMBERLY-CLARK), which has three domains that evaluate cognitive, behavioral, and emotional regulation, and a Global Executive Composite score provides an overall snapshot of executive functioning. There are no missing item responses in the protocol. The Negativity, Infrequency, and Inconsistency scales are not elevated, suggesting she did not respond to the protocol in an overly negative, haphazard, extreme, or inconsistent manner. In the context of these validity considerations, ratings of Ms. Colwell's everyday executive function suggest some areas of concern. The overall index score, the GEC, was highly elevated (GEC T = 76, %ile = 99). The Behavior Regulation Index (BRI) score was within normal limits (BRI T = 53, %ile = 73), but the Emotion Regulation Index (ERI) score was mildly elevated (ERI T = 65, %ile = 90) and the  Cognitive Regulation Index (CRI) score  was highly elevated (CRI T = 84, %ile = >99). Ms. Dirocco indicated difficulty with her ability to resist impulses, adjust well to changes, get going on tasks and activities and independently generate ideas, sustain working memory, plan and organize her approach to problem solving appropriately, and be appropriately cautious in her approach to tasks and check for mistakes. She did not describe her ability to be aware of her functioning in social settings, react to events appropriately, and keep materials and belongings reasonably well-organized as problematic. However, the [employer] scales approached an abnormal elevation.   Scale/Index  Raw Score T Score Percentile Qualitative Description  Inhibit 16 71 99 Moderately Elevated  Self-Monitor 9 58 85 Within Normal Limits  Behavior Regulation Index (BRI) 25 67 95 Mildly Elevated  Shift 12 67 95 Mildly Elevated  Emotional Control 11 52 71 Within Normal Limits  Emotion Regulation Index (ERI) 23 60 82 Mildly Elevated  Initiate 19 76 >99 Highly Elevated  Working Memory 16 70 96 Moderately Elevated  Plan/Organize 15 65 94 Mildly Elevated  Task Monitor 13 73 99 Moderately Elevated  Organization of Materials 15 62 91 Approaching an Abnormal Elevation  Cognitive Regulation Index (CRI) 78 72 97 Moderately Elevated  Global Executive Composite (GEC) 126 69 97 Mildly Elevated   Validity Scale Raw Score Cumulative Percentile Protocol Classification  Inconsistency 5 98 Acceptable  Negativity 0 98 Acceptable  Infrequency 0 98 Acceptable   Behavior Rating Inventory of Executive Function, Second Edition EVENT ORGANISER) Informant:  Ms. Lincoln's significant other, Mr. Alm Blacker, completed the Informant Form of the Behavior Rating Inventory of Executive Function-Adult Version, Second Edition [id number]), which is equivalent to the Self-Report version and has three domains that evaluate cognitive, behavioral, and  emotional regulation, and a Global Executive Composite score provides an overall snapshot of executive functioning. There are no missing item responses in the protocol. The Negativity, Infrequency, and Inconsistency scales are not elevated, suggesting he did not respond to the protocol in an overly negative, haphazard, extreme, or inconsistent manner. In the context of these validity considerations, Mr. Hinson's ratings of Ms. Schwertner's everyday executive function suggest some areas of concern. The overall index score, the GEC, was highly elevated (GEC T = 76, %ile = 99). The Behavior Regulation Index (BRI) score was within normal limits (BRI T = 53, %ile = 73), but the Emotion Regulation Index (ERI) score was mildly elevated (ERI T = 65, %ile = 90) and the Cognitive Regulation Index (CRI) score was highly elevated (CRI T = 84, %ile = >99). Mr. Blacker indicated Ms. Delucia experiences difficulty with her ability to get going on tasks and activities and independently generate ideas, sustain working memory, plan and organize her approach to problem solving appropriately, be appropriately cautious in her approach to tasks and check for mistakes, and keep materials and belongings reasonably well-organized. He did not describe her ability to resist impulses, be aware of her functioning in social settings, adjust well to changes, and react to events appropriately as problematic. However, the Shift and Emotional Control scales approached an abnormal elevation. The overall profile suggests that Ms. Goguen exhibits difficulties with working memory and with planning and organization. Individuals with similar elevations on the Working Memory scale but without significant elevations on the Inhibit, Shift, and Emotional Control scales are often described as generally inattentive. Without adequate working memory, her ability to maintain focus for an appropriate length of time may be compromised. Further, she may have secondary  difficulty grasping the gist of  new information and developing a plan of approach for future-oriented problem-solving. This profile is often seen in individuals with inattentive-type attention disorders. Moreover, the elevated scores on the Working Memory, Optician, Dispensing, Initiate, and Task-Monitor scales suggest that Ms. Shisler is perceived as having significant difficulties with general cognitive problem-solving. More specifically, she is likely to have significant difficulty with independent problem-solving due to difficulty actively holding multiple pieces of information in mind and systematically constructing a plan. As a result, this may negatively impact her attempts to get started on these tasks. In addition, she is likely to have difficulty monitoring her performance during tasks.  Scale/Index  Raw Score T Score Percentile Qualitative Description  Inhibit 12 57 85 Within Normal Limits  Self-Monitor 7 47 59 Within Normal Limits  Behavior Regulation Index (BRI) 19 53 73 Within Normal Limits  Shift 12 63 97 Approaching an Elevation  Emotional Control 15 61 87 Approaching an Elevation  Emotion Regulation Index (ERI) 27 65 90 Mildly Elevated  Initiate 22 86 >99 Highly Elevated  Working Memory 19 79 >99 Highly Elevated  Plan/Organize 19 77 >99 Highly Elevated  Task-Monitor 14 70 >99 Moderately Elevated  Organization of Materials 19 70 96 Moderately Elevated  Cognitive Regulation Index (CRI) 93 84 >99 Highly Elevated  Global Executive Composite (GEC) 139 76 99 Highly Elevated   Validity Scale Raw Score Cumulative Percentile Protocol Classification  Inconsistency 2 99 Acceptable  Negativity 0 98 Acceptable  Infrequency 0 98 Acceptable   BEHAVIORAL FUNCTIONING   Patient Health Questionnaire-9 (PHQ-9): Ms. Munroe completed the PHQ-9, a self-report measure of depressive symptoms. She scored 15/27, which indicates moderately severe depression. Upon follow-up, Ms. Jose shared she may have been  off of her antidepressant or had not been back on [the antidepressant] that long when she completed the PHQ-9 and GAD-7 measures, indicating a belief that if she filled out these measure in more recent days, her severity scores would have likely been lower.   Generalized Anxiety Disorder-7 (GAD-7): Ms. Melling completed the GAD-7, a self-report measure that assesses symptoms of anxiety. She scored 13/21, which indicates moderate anxiety.   Adult ADHD Self-Report Scale Symptom Checklist (ASRS): Ms. Steedley reported the following symptoms as sometimes: difficulty wrapping up the final details of a project following the completion of challenging aspects, difficulty getting things in order when a task requires organization, problems remembering appointments or obligations, making careless mistakes when working on boring or complex projects, misplacing or has difficulty finding things, feeling restless or fidgety, and talking too much in social situations. She endorsed the following symptoms as occurring often: struggling to sustain attention when doing boring or repetitive work, being distracted by the noise around her, difficulty relaxing, interrupting others or finishing their sentences, and interrupting others when they are busy. She endorsed the following symptoms as very often: avoiding or delaying getting started on tasks requiring a lot of thought, fidgeting or squirming, struggling to concentrate on what people say even when they are speaking directly to her, and difficulty waiting for her turn in turn-taking situations. The endorsement of at least four items in Part A is highly consistent with ADHD in adults. The frequency scores of Part B provides additional cues. Ms. Halfhill scored 5/6 on Part A and 7/12 on Part B, which is considered a positive screening for ADHD.   Personality Assessment Inventory (PAI): The PAI is an objective inventory of adult personality. The validity indicators suggested Ms.  Stakes's profile is interpretable (ICN T = 70, INF T =  51, NIM T = 44, and PIM T = 41). She appeared to appropriately attend to item content (INF T = 51) and did not appear to try to portray herself in an overly negative (NIM T = 44) or unrealistically favorable manner (PIM T = 41); however, there was some inconsistency in her responses to similar items (ICN T = 70). As such, interpretations based on her PAI results should be done with caution. She endorsed experiencing some tension (ANX-A T = 62); vegetative signs of depression (DEP-P T = 67; e.g., sleep disturbance, reduced drive, and/or loss of appetite); and problems with concentration and decision making (SCZ-T T = 64). She also endorsed having little interest in the lives of others (SCZ-S T = 64) and being somewhat distant in personal relationships (WRM T = 24). She indicated she is assertive and not intimidated by confrontation (AGG-V T = 65), and is generally satisfied with herself and sees little need for significant changes in her behavior (RXR T = 51).    SUMMARY AND CLINICAL IMPRESSIONS: Ms. Latino is a 49 year old female who was referred by [name], PA for an evaluation to determine if she currently meets criteria for a diagnosis of Attention-Deficit/Hyperactivity Disorder (ADHD).   Ms. Vivar reported an increase in employment stressors, and responsibilities contributed to an exacerbation of ADHD-related symptoms. She also reported how this led to her doing a fair amount of research on ADHD in women, indicating how the information she was receiving was similar to her experiences. She shared that her brother and son have been diagnosed with ADHD, and that her significant other, whom she noted has ADHD, expressed concerns she may have ADHD as well.   Ms. Carlo was administered assessments during the evaluation to measure her current cognitive abilities. Her verbal comprehension abilities were in the very high range and similar. Her  ability to sustain attention, concentrate, and exert mental control was in the average range and variable, which suggests a relative weakness in attention, concentration, mental control, and shorter-term auditory memory. Her performance pattern suggests she best utilizes working memory when performing active mental manipulation of the information she has registered and maintained, rather than when actively maintaining, refreshing, and updating it. It is also possible that a slower pace benefited her auditory registration of information. Results of the CNS Vital Signs indicated low neurocognitive processing ability with impairments in reaction time, cognitive flexibility, and executive function, as well as weakness in psychomotor speed and motor speed. This performance pattern suggests she has difficulty tracking and responding to a variety of stimuli over extended periods and when performing mental tasks requiring vigilance.   During the clinical interview and on self-report measures, Ms. Gluth endorsed executive functioning concerns that include attentional dysregulation, hyperactivity- and impulsivity-related symptoms, and meeting the full criteria for ADHD. She also expressed a belief her ADHD-related symptoms have occurred since childhood and are consistent across situations and independent of mood, as well as indicated her ADHD-related symptoms have persisted despite use of psychotropic medication for other endorsed mental health concerns. Moreover, her significant other, Mr. Alm Blacker, indicated she is experiencing multiple significant executive functioning issues.  While PAI validity concerns make creating a diagnostic conclusion difficult, when considering the aforementioned information; endorsed and/or demonstrated impairment or weakness on measures of attention, executive functioning, working memory, reaction time, cognitive flexibility, and psychomotor speed; and a reported familial history of ADHD,  a diagnosis of F90.2 Attention-Deficit/Hyperactivity Disorder, Combined Presentation, Moderate appears warranted. The specifier of Moderate was assigned as  she endorsed symptoms in excess of what is needed to make the diagnosis and indicated they cause significant difficulties in her academic (e.g., regularly having trouble sustaining her attention in classes in which she just [facility] to] listen, as well as a propensity to study or start and complete homework shortly before it's deadline and to skip classes in college as there was less pressure from others to attend [class]), occupational (e.g., noting she is always exhausted and ha[s] nothing left by the end of her work day as she is giving everything [she has] to give to function at work, adding that she spend[s] most of [her] weekends recovering from the [work] week), social (e.g., frequently engaging in excessive talking and interrupting of others), and daily (e.g., regularly experiencing being easily distracted, task initiation and completion issues, disorganization, and forgetfulness) functioning.   Ms. Eunice discussed a history of depressive episodes with the last depressive episode being approximately ten years ago; generalized anxiety that she utilizes psychotropic medication to assist with; sleep onset and maintenance issues that she utilizes Trazadone to assist with, as well as regularly not feeling rested upon waking and tossing and turning in her sleep; overeating-related behaviors that she noted has largely ceased after use of a GLP-1 medication; emotional eating-related behaviors; and periods of passive suicidal ideation without plan or intent. As such, the PHQ-9, GAD-7, and PAI were administered. Her results suggest she experiences moderately severe depression- and moderate anxiety-related symptoms. While Ms. Mcloud and chart review gave strong indications of depressive disorder, anxiety disorder, and sleep-wake disorder, given the  limited scope of this evaluation, it was not possible to determine if the full criteria for the aforementioned disorders or eating disorder are met, or if her diagnosis of ADHD better explains the symptoms. However, should any of the aforementioned be ruled in, they would likely be in addition to her diagnosis of ADHD, as she described her ADHD-related concerns as occurring before some of the concerns, as consistent across situations, and independent of mood.  DSM-5 Diagnostic Impressions: F90.2 Attention-Deficit/Hyperactivity Disorder, Combined Presentation, Moderate  RECOMMENDATIONS: Ms. Elsayed would likely benefit from a consultation regarding medication for ADHD symptoms.   Individual therapeutic services may assist in processing a diagnosis of ADHD and discussing coping and compensatory strategies. Ms. Vanduyn would likely benefit from making use of strategies for ADHD symptoms:  Setting a timer to complete tasks. Break tasks into manageable chunks and spread them out over more extended periods with breaks.  Utilizing lists and day calendars to keep track of tasks.  Answering emails daily.  Improve listening skills by asking the speaker to give information in smaller chunks and asking for explanations and clarification as needed. Leaving more than the anticipated time to complete tasks. It may help to keep tasks brief, well within your attention span, and a mix of both high and low-interest tasks. Tasks may be gradually increased in length. Practice proactive planning by setting aside time every evening to plan for the next day (e.g., prepare needed materials or pack the car the night before).  Learn how to make a practical and reasonable to-do list of important tasks and priorities and always keep it easily accessible. Make additional copies in case it is lost or misplaced. Utilize visual reminders by posting appointments, to-do lists, or schedules in strategic areas at home and work.   Practice using an appointment book, smartphone, or other tech device, or a daily planning calendar, and learn to write down appointments and commitments immediately. Keep notepads or  use a portable audio recorder to capture important ideas that would be beneficial to recall later. Learn and practice time management skills. Purchase a programmable alarm watch or set an alarm on a smartphone to avoid losing track of time.  Use a color-coded file system, desk and closet organizers, storage boxes, or other organization devices to reduce clutter and improve efficiency and structure.  Implement ways to become more aware of your actions and to inhibit or adjust them as warranted (e.g., reviewing videos of your actions, considering consequences of obeying or not obeying the rules of various upcoming situations, having a trusted other to discuss plans with, and/or provide cues to stop certain behaviors, and make visual cues for rules you would like to follow). The 4Rs: Read just one paragraph, recite out loud in a soft voice or whisper what was important in that material, write that material down in a notebook, then review what you just wrote. Stay flexible and be prepared to change your plans, as symptom breakthroughs and crises will likely occur periodically. Ms. Alig may benefit from mindfulness training to address symptoms of inattention.  Mental alertness/energy can be raised by increasing exercise; improving sleep; eating a healthy diet; and managing stress. Consulting with a physician regarding any changes to the physical regimen is recommended. Failing at Normal: An ADHD Success Story by Harlene Solar is a great overview of ADHD. Dr. Nelwyn Pica also has a YouTube channel called, Nelwyn Pica, PhD - Dedicated to ADHD Science+ with helpful videos on ADHD-related topics: [email or web address]  Applications:  RescueTime. Tracks your activities on your phone and/or  computer to determine how productive you have been and what distracted you. Free two-week trial.  [email or web address] . It uses engineered audio that may reduce distractions and assist with focus. Free 15-day trial. Freedom. Allows you to highlight days and times you want to block yourself from certain sites or apps. Free trial. Merrily.  It allows you to input your bank accounts and creates a visual layout of information about your financial goals, budget management, alerts, etc. May offer a free trial. Boomerang. It allows you to schedule times an email is sent and to see if others have received or opened your email. Ten messages free per month and a free trial of the premium version. IFTTT. Uses channels to create various actions (e.g., if you are mentioned in an email, highlight it in your inbox, and if you miss a call, add it to a to-do list). Free and premium versions. Unroll.me. Cleans up your email by unsubscribing from what you do not want to receive while still getting everything you do. Free. Finish. It allows you to divide two-list tasks into short-term, mid-term, and long-term tasks and determine how much time is left for a task. Focus mode hides non-priority tasks.  Autosilent. Turns your phone ringer on and off based on specified calendars, geo-fences, timers, etc. $3.99. Freakyalarm. It makes you solve math problems to disable an alarm. $1.99. Wake N Shake. It makes you vigorously shake your phone to stop the alarm. $.99. Todoist. It allows you to add sub-tasks to tasks and includes email and web plugins to make it work across the system. Premium has location-based reminders, calendar sync, productive tracking, etc.  Sleep Cycle. Utilize your phone's motion sensors to catch movement while you are asleep. The alarm will wake you as early as 30 minutes before your alarm based on your lightest sleep phase and show you how daily activities affect your sleep quality.  Colletta. Gamifies mental  wellness  by letting you choose from a wide variety of self-care exercises to complete, which it rewards with the ability to care for a virtual pet. It also includes features such as mood tracking, breathing exercises, guided journaling, and setting mutual goals with friends. Books: Taking Charge of Adult ADHD Second Edition by Dr. Nelwyn Pica The ADHD Effect on Marriage by Eleanor Bowers The Couples Guide to Thriving with ADHD by Eleanor Bowers The ADHD Guide to Career Success by Dr. Rollo Fess  Organizations that are a reliable source of information on ADHD:  Children and Adults with Attention-Deficit/Hyperactivity Disorder (CHADD): chadd.org  Attention Deficit Disorder Association (ADDA): hotternames.de ADD Resources: addresources.org ADD WareHouse: addwarehouse.com World Federation of ADHD: adhd-federation.org ADDConsults: addconsults.com Compilation of ADHD resources: https://www.harrell.com/ Future evaluation, if deemed necessary, and/or to determine the effectiveness of recommended interventions.   Frederic Fire, Psy.D. Licensed Psychologist - HSP-P [phone]   References  Pica, R. A. (2021). Taking charge of adult ADHD: proven strategies to succeed at work, at   home, and in relationships (pp. 6-10 and 272-276). [employer].               Frederic ONEIDA Fire, PsyD

## 2024-10-09 ENCOUNTER — Encounter: Payer: Self-pay | Admitting: Physician Assistant

## 2024-10-13 ENCOUNTER — Encounter: Payer: Self-pay | Admitting: Physician Assistant

## 2024-10-13 ENCOUNTER — Encounter: Payer: Self-pay | Admitting: Pediatrics

## 2024-10-20 ENCOUNTER — Ambulatory Visit: Admitting: Physician Assistant

## 2024-10-20 ENCOUNTER — Encounter: Payer: Self-pay | Admitting: Physician Assistant

## 2024-10-20 VITALS — BP 112/80 | HR 62 | Temp 97.3°F | Ht 65.0 in | Wt 163.4 lb

## 2024-10-20 DIAGNOSIS — F902 Attention-deficit hyperactivity disorder, combined type: Secondary | ICD-10-CM

## 2024-10-20 MED ORDER — LISDEXAMFETAMINE DIMESYLATE 30 MG PO CAPS: 30.0000 mg | ORAL_CAPSULE | Freq: Every day | ORAL | 0 refills | Status: AC

## 2024-10-20 NOTE — Progress Notes (Signed)
 "  History of Present Illness:   Chief Complaint  Patient presents with   ADHD    Pt was Dx and would like to discuss medication.    Discussed the use of AI scribe software for clinical note transcription with the patient, who gave verbal consent to proceed.  History of Present Illness   Cynthia Casey is a 49 year old female who presents for consideration of starting Vyvanse  for ADHD management.  She was recently diagnosed with Attention Deficit Hyperactivity Disorder (ADHD) by a clinical psychologist (reviewed records in epic).   She is interested in starting Vyvanse  for ADHD, motivated by her son's good response and a desire to improve symptom control beyond her current coping strategies. She tends to have minor medication side effects and often finds very low doses ineffective, and she has tolerated GLP-1 medications and antidepressants with minimal issues, which informs her dosing preference today. Her partner, a engineer, civil (consulting), can help monitor her blood pressure at home. Her thyroid condition is reported as controlled.        Past Medical History:  Diagnosis Date   Allergy    Anxiety    Asthma    Depression    Thyroid disease      Social History[1]  Past Surgical History:  Procedure Laterality Date   APPENDECTOMY     COLPOSCOPY      Family History  Problem Relation Age of Onset   Thyroid disease Mother    Hypertension Mother        well-controlled   Heart attack Mother        second; stent x 2   Anxiety disorder Mother    Depression Mother    Anxiety disorder Sister    Depression Sister    Epilepsy Sister    Alcohol abuse Sister    ADD / ADHD Brother    Thyroid disease Maternal Grandmother    Hypertension Maternal Grandmother    Stroke Maternal Grandmother    ADD / ADHD Son    Breast cancer Maternal Aunt    Colon cancer Maternal Aunt     Allergies[2]  Current Medications:  Current Medications[3]   Review of Systems:   Negative unless otherwise  specified per HPI.  Vitals:   Vitals:   10/20/24 1335  BP: 112/80  Pulse: 62  Temp: (!) 97.3 F (36.3 C)  TempSrc: Temporal  SpO2: 98%  Weight: 163 lb 6.1 oz (74.1 kg)  Height: 5' 5 (1.651 m)     Body mass index is 27.19 kg/m.  Physical Exam:   Physical Exam Vitals and nursing note reviewed.  Constitutional:      General: She is not in acute distress.    Appearance: She is well-developed. She is not ill-appearing or toxic-appearing.  Cardiovascular:     Rate and Rhythm: Normal rate and regular rhythm.     Pulses: Normal pulses.     Heart sounds: Normal heart sounds, S1 normal and S2 normal.  Pulmonary:     Effort: Pulmonary effort is normal.     Breath sounds: Normal breath sounds.  Skin:    General: Skin is warm and dry.  Neurological:     Mental Status: She is alert.     GCS: GCS eye subscore is 4. GCS verbal subscore is 5. GCS motor subscore is 6.  Psychiatric:        Speech: Speech normal.        Behavior: Behavior normal. Behavior is cooperative.  Assessment and Plan:   Assessment and Plan    Attention-deficit hyperactivity disorder, combined type Considering Vyvanse  due to effectiveness in her son and affordability. Aware of side effects. Prefers starting at 30 mg. Blood pressure and thyroid stable. Requires monitoring due to controlled substance status. - Prescribed Vyvanse  30 mg daily. - Monitor blood pressure regularly. - Schedule follow-up every three months, with at least one in-person visit annually. - Adjust dosage based on response and side effects.      [name], PA-C    [1]  Social History Tobacco Use   Smoking status: Never   Smokeless tobacco: Never  Vaping Use   Vaping status: Never Used  Substance Use Topics   Alcohol use: Yes    Alcohol/week: 2.0 standard drinks of alcohol    Types: 1 Glasses of wine, 1 Standard drinks or equivalent per week    Comment: cocktails and beers are occasional    Drug use: Never  [2]   Allergies Allergen Reactions   Penicillins Other (See Comments)    Pt states this was a childhood allergy.   [3]  Current Outpatient Medications:    acyclovir  (ZOVIRAX ) 800 MG tablet, Take 1 tablet (800 mg total) by mouth 3 (three) times daily as needed., Disp: 30 tablet, Rfl: 1   albuterol  (VENTOLIN  HFA) 108 (90 Base) MCG/ACT inhaler, Inhale 2 puffs into the lungs every 6 (six) hours as needed for wheezing or shortness of breath., Disp: 8 g, Rfl: 0   escitalopram  (LEXAPRO ) 10 MG tablet, Take 1 tablet (10 mg total) by mouth daily., Disp: 90 tablet, Rfl: 1   furosemide  (LASIX ) 20 MG tablet, Take 1 tablet (20 mg total) by mouth daily as needed for fluid., Disp: 90 tablet, Rfl: 1   levonorgestrel  (MIRENA ) 20 MCG/24HR IUD, 1 each by Intrauterine route once. Inserted 12/29/2020, Disp: , Rfl:    levothyroxine (SYNTHROID ) 150 MCG tablet, Take 150 mcg by mouth daily before breakfast., Disp: , Rfl:    liothyronine (CYTOMEL) 5 MCG tablet, Take 10 mcg by mouth daily., Disp: , Rfl:    lisdexamfetamine  (VYVANSE ) 30 MG capsule, Take 1 capsule (30 mg total) by mouth daily., Disp: 30 capsule, Rfl: 0   LORazepam  (ATIVAN ) 0.5 MG tablet, Take 1 tablet (0.5 mg total) by mouth 2 (two) times daily as needed for anxiety., Disp: 20 tablet, Rfl: 0   montelukast  (SINGULAIR ) 10 MG tablet, TAKE 1 TABLET BY MOUTH EVERY DAY, Disp: 90 tablet, Rfl: 0   Tirzepatide-Weight Management 15 MG/0.5ML SOLN, Inject 7.5 mg into the skin once a week., Disp: , Rfl:    traZODone  (DESYREL ) 100 MG tablet, Take 1 tablet (100 mg total) by mouth at bedtime., Disp: 90 tablet, Rfl: 3  "

## 2024-11-01 ENCOUNTER — Encounter

## 2024-11-08 ENCOUNTER — Encounter: Admitting: Pediatrics

## 2024-11-23 ENCOUNTER — Ambulatory Visit: Admitting: Physician Assistant

## 2025-07-28 ENCOUNTER — Encounter: Admitting: Physician Assistant
